# Patient Record
Sex: Male | Born: 1950
Health system: Southern US, Community
[De-identification: ages and names within clinical notes are randomized; demographics above are authoritative.]

## PROBLEM LIST (undated history)

## (undated) DIAGNOSIS — I10 Essential (primary) hypertension: Secondary | ICD-10-CM

## (undated) HISTORY — DX: Essential (primary) hypertension: I10

---

## 1996-11-29 HISTORY — PX: OTHER SURGICAL HISTORY: SHX169

## 2011-11-30 HISTORY — PX: X-STOP IMPLANTATION: SHX2677

## 2013-01-28 ENCOUNTER — Ambulatory Visit: Payer: Self-pay

## 2013-01-28 ENCOUNTER — Ambulatory Visit: Payer: 59 | Admitting: Internal Medicine

## 2013-01-28 ENCOUNTER — Encounter: Payer: Self-pay | Admitting: Internal Medicine

## 2013-01-28 VITALS — BP 172/113 | HR 95 | Temp 97.8°F | Resp 20

## 2013-01-28 DIAGNOSIS — M79645 Pain in left finger(s): Secondary | ICD-10-CM

## 2013-01-28 DIAGNOSIS — S61209A Unspecified open wound of unspecified finger without damage to nail, initial encounter: Secondary | ICD-10-CM

## 2013-01-28 DIAGNOSIS — S61228S Laceration with foreign body of other finger without damage to nail, sequela: Secondary | ICD-10-CM

## 2013-01-28 DIAGNOSIS — M79609 Pain in unspecified limb: Secondary | ICD-10-CM

## 2013-01-28 MED ORDER — DOXYCYCLINE HYCLATE 100 MG PO TABS: 100.0000 mg | ORAL_TABLET | Freq: Two times a day (BID) | ORAL | Status: DC

## 2013-01-28 MED ORDER — HYDROCODONE-ACETAMINOPHEN 5-325 MG PO TABS: 1.0000 | ORAL_TABLET | Freq: Four times a day (QID) | ORAL | Status: DC | PRN

## 2013-01-28 NOTE — Patient Instructions (Signed)
Wound Care  Wound care helps prevent pain and infection.    You may need a tetanus shot if:   You cannot remember when you had your last tetanus shot.   You have never had a tetanus shot.   The injury broke your skin.  If you need a tetanus shot and you choose not to have one, you may get tetanus. Sickness from tetanus can be serious.  HOME CARE     Only take medicine as told by your doctor.   Clean the wound daily with mild soap and water.   Change any bandages (dressings) as told by your doctor.   Put medicated cream and a bandage on the wound as told by your doctor.   Change the bandage if it gets wet, dirty, or starts to smell.   Take showers. Do not take baths, swim, or do anything that puts your wound under water.   Rest and raise (elevate) the wound until the pain and puffiness (swelling) are better.   Keep all doctor visits as told.  GET HELP RIGHT AWAY IF:     Yellowish-white fluid (pus) comes from the wound.   Medicine does not lessen your pain.   There is a red streak going away from the wound.   You have a fever.  MAKE SURE YOU:     Understand these instructions.   Will watch your condition.   Will get help right away if you are not doing well or get worse.  Document Released: 08/24/2008 Document Revised: 02/07/2012 Document Reviewed: 03/21/2011  [employer] Patient Information 2013 [employer].
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
## 2017-04-28 ENCOUNTER — Other Ambulatory Visit: Payer: Self-pay | Admitting: Orthopedic Surgery

## 2017-04-28 DIAGNOSIS — G8929 Other chronic pain: Secondary | ICD-10-CM

## 2017-04-28 DIAGNOSIS — M5442 Lumbago with sciatica, left side: Principal | ICD-10-CM

## 2017-05-03 ENCOUNTER — Other Ambulatory Visit: Payer: Self-pay | Admitting: Orthopedic Surgery

## 2017-05-03 ENCOUNTER — Ambulatory Visit
Admission: RE | Admit: 2017-05-03 | Discharge: 2017-05-03 | Disposition: A | Discharge status: Home / Self Care | Payer: Self-pay | Source: Ambulatory Visit | Attending: Orthopedic Surgery | Admitting: Orthopedic Surgery

## 2017-05-03 DIAGNOSIS — R52 Pain, unspecified: Secondary | ICD-10-CM

## 2017-05-04 ENCOUNTER — Ambulatory Visit
Admission: RE | Admit: 2017-05-04 | Discharge: 2017-05-04 | Disposition: A | Discharge status: Home / Self Care | Payer: BLUE CROSS/BLUE SHIELD | Source: Ambulatory Visit | Attending: Orthopedic Surgery | Admitting: Orthopedic Surgery

## 2017-05-04 DIAGNOSIS — G8929 Other chronic pain: Secondary | ICD-10-CM

## 2017-05-04 DIAGNOSIS — M5442 Lumbago with sciatica, left side: Principal | ICD-10-CM

## 2017-05-04 IMAGING — CT CT L SPINE W/ CM
1 of 6 series · 6 of 14 positions shown, 8 images · non-contrast
Comparison: MRI of the lumbar spine [DATE]

CLINICAL DATA: Low back pain extending into the left buttock and
posterior calf. Tingling in the right lower extremity, more
laterally.
TECHNIQUE: Contiguous axial images were obtained through the Lumbar spine after
the intrathecal infusion of infusion. Coronal and sagittal
reconstructions were obtained of the axial image sets.

[Series 3: l spine soft · axial · 0.27mm/px · z∈[-323,-143]mm · 6 of 86 slices shown, 8 images]
[im 13/86  soft-tissue]
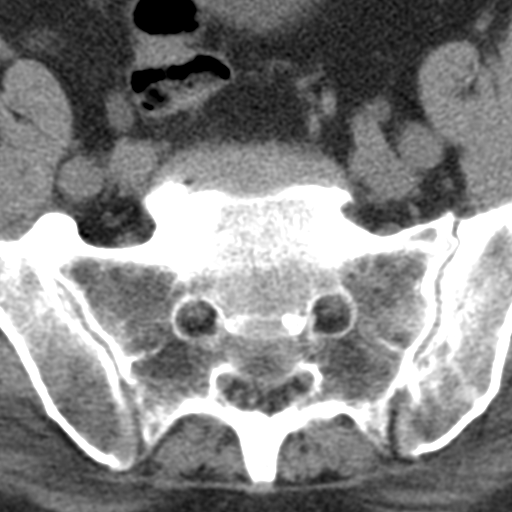
[im 13/86  bone]
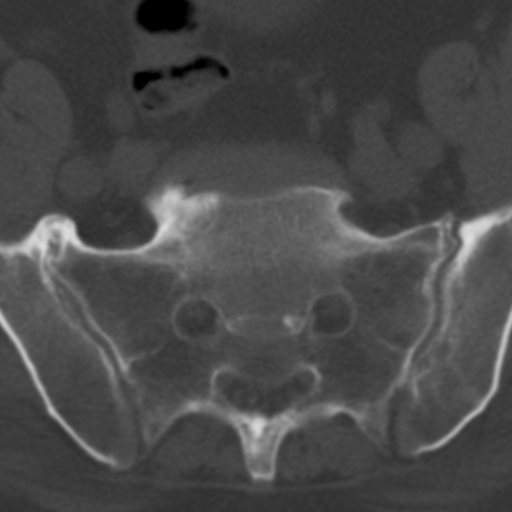
[im 25/86  bone]
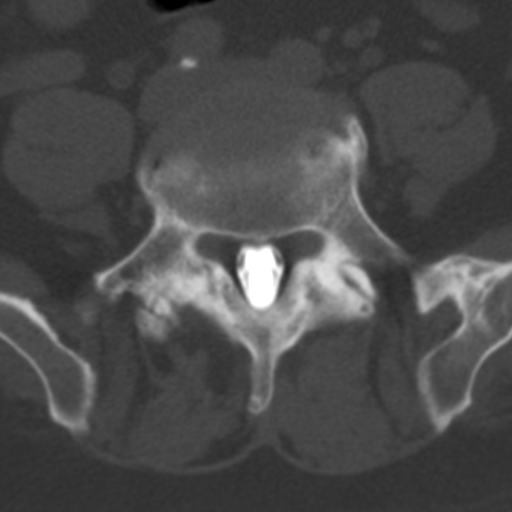
[im 37/86  bone]
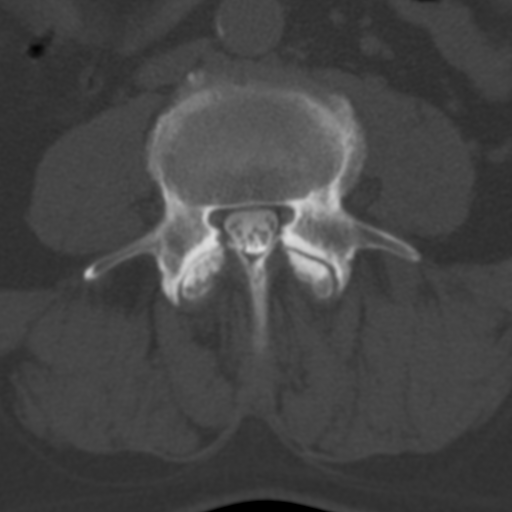
[im 49/86  bone]
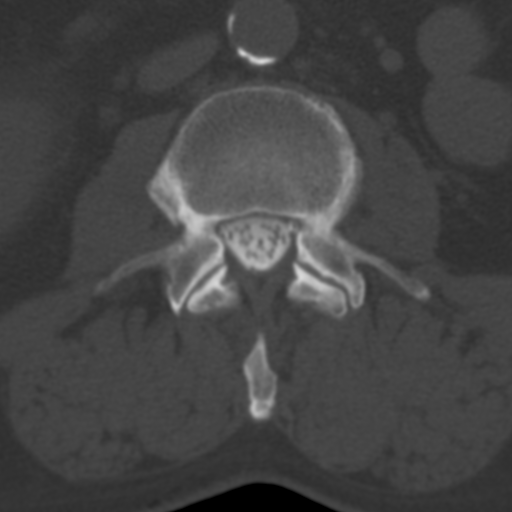
[im 61/86  soft-tissue]
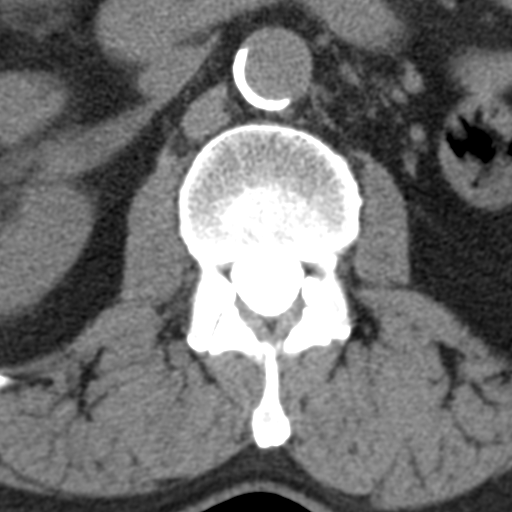
[im 61/86  bone]
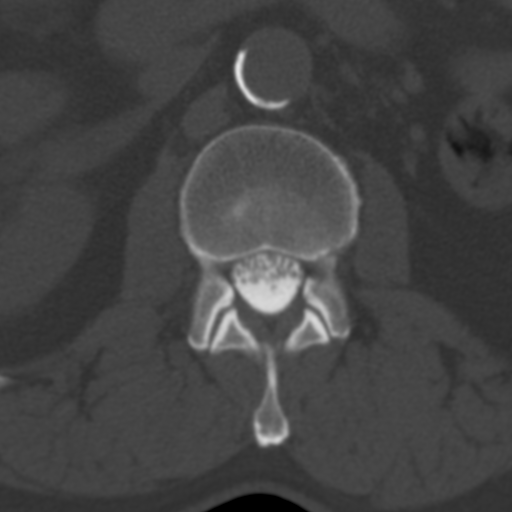
[im 73/86  bone]
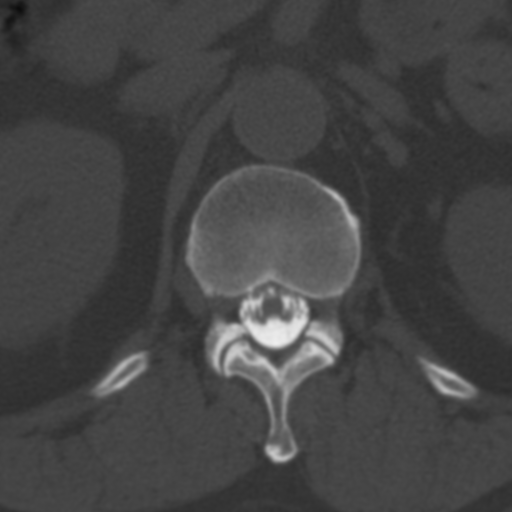

[6 of 14 positions shown; findings below may reference images not displayed]

EXAM:
LUMBAR MYELOGRAM

FLUOROSCOPY TIME:  Radiation Exposure Index (as provided by the
fluoroscopic device): 201.01 uGy*m2

Fluoroscopy Time:  19 seconds

Number of Acquired Images:  16

PROCEDURE:
After thorough discussion of risks and benefits of the procedure
including bleeding, infection, injury to nerves, blood vessels,
adjacent structures as well as headache and CSF leak, written and
oral informed consent was obtained. Consent was obtained by Dr.
KAELA. Time out form was completed.

Patient was positioned prone on the fluoroscopy table. Local
anesthesia was provided with 1% lidocaine without epinephrine after
prepped and draped in the usual sterile fashion. Puncture was
performed at L2-3 using a 3 1/2 inch 22-gauge spinal needle via
right paramedian approach. Using a single pass through the dura, the
needle was placed within the thecal sac, with return of clear CSF.
15 mL of Isovue M 200 was injected into the thecal sac, with normal
opacification of the nerve roots and cauda equina consistent with
free flow within the subarachnoid space.

I personally performed the lumbar puncture and administered the
intrathecal contrast. I also personally supervised acquisition of
the myelogram images.
FINDINGS: LUMBAR MYELOGRAM FINDINGS:

Five non rib-bearing lumbar type vertebral bodies are present.
Slight anterolisthesis is present at L4-5. Vertebral body heights
and alignment are otherwise maintained. Mild disc bulging is present
at L3-4 without significant stenosis. A broad-based disc protrusion
and facet disease is present at L4-5. There is moderate central
canal stenosis with left greater than right subarticular narrowing.
A broad-based disc protrusion with chronic loss of disc height is
evident at L5-S1. Mild subarticular narrowing is present
bilaterally.

The disc disease at L3-4 and L4-5 is slightly worse with standing.
This does not change significantly with flexion or extension. There
is no abnormal motion with flexion or extension.

CT LUMBAR MYELOGRAM FINDINGS:

The lumbar spine is imaged from T11-12 through S3-4. Slight
rightward curvature is centered at L4. Vertebral body heights are
maintained. There is calcification of the disc at T12-L1. The conus
medullaris terminates at L1, within normal limits.

Atherosclerotic calcifications are present in the aorta and branch
vessels without aneurysm. The right SI joint is fused. Degenerate
scratched at the SI joints are fused bilaterally.

L1-2:  No significant disc protrusion or stenosis is present.

L2-3: Mild disc bulging and facet hypertrophy is present without
significant change in mild bilateral foraminal narrowing.

L3-4: A broad-based disc protrusion is present. Moderate facet
hypertrophy is evident bilaterally, left greater than right. This
results in mild subarticular narrowing, left greater than right.
Facet spurring contributes mild foraminal narrowing, left greater
than right.

L4-5: A broad-based disc protrusion is present. Moderate facet
hypertrophy is present bilaterally with prominent spur on the right.
This contributes to moderate subarticular stenosis bilaterally,
right greater than left. Moderate foraminal narrowing is noted
bilaterally as well.

L5-S1: A shallow central disc protrusion is again seen. This
potentially contacts the traversing S1 nerve roots. Moderate facet
spurring is worse on the right. This contributes to mild bilateral
foraminal narrowing.
IMPRESSION: 1. Moderate central canal stenosis at L4-5 secondary to a
broad-based disc protrusion and advanced facet hypertrophy with
spurring.
2. Moderate foraminal stenosis bilaterally at L4-5.
3. Shallow central disc protrusion potentially contacts the
traversing S1 nerve roots bilaterally at L5-S1.
4. Facet spurring contributes to mild bilateral foraminal stenosis
at L5-S1.
5. Mild subarticular and foraminal narrowing bilaterally L3-4 is
worse on the left.
6. Mild disc bulging and facet hypertrophy at L2-3 without
significant central canal stenosis. Mild bilateral foraminal
narrowing at this level is stable.
7. Aortic atherosclerosis without aneurysm.

## 2017-05-04 MED ORDER — IOPAMIDOL (ISOVUE-M 200) INJECTION 41%
15.0000 mL | Freq: Once | INTRAMUSCULAR | Status: AC
  Administered 2017-05-04: 15 mL via INTRATHECAL

## 2017-05-04 MED ORDER — DIAZEPAM 5 MG PO TABS
10.0000 mg | ORAL_TABLET | Freq: Once | ORAL | Status: AC
  Administered 2017-05-04: 10 mg via ORAL

## 2017-05-04 NOTE — Progress Notes (Signed)
Patient states he has been off Tramadol/Ultram for at least the past two days.  Brita Romp, RN

## 2017-05-04 NOTE — Discharge Instructions (Signed)
Myelogram Discharge Instructions  1. Go home and rest quietly for the next 24 hours.  It is important to lie flat for the next 24 hours.  Get up only to go to the restroom.  You may lie in the bed or on a couch on your back, your stomach, your left side or your right side.  You may have one pillow under your head.  You may have pillows between your knees while you are on your side or under your knees while you are on your back.  2. DO NOT drive today.  Recline the seat as far back as it will go, while still wearing your seat belt, on the way home.  3. You may get up to go to the bathroom as needed.  You may sit up for 10 minutes to eat.  You may resume your normal diet and medications unless otherwise indicated.  Drink lots of extra fluids today and tomorrow.  4. The incidence of headache, nausea, or vomiting is about 5% (one in 20 patients).  If you develop a headache, lie flat and drink plenty of fluids until the headache goes away.  Caffeinated beverages may be helpful.  If you develop severe nausea and vomiting or a headache that does not go away with flat bed rest, call [phone].  5. You may resume normal activities after your 24 hours of bed rest is over; however, do not exert yourself strongly or do any heavy lifting tomorrow. If when you get up you have a headache when standing, go back to bed and force fluids for another 24 hours.  6. Call your physician for a follow-up appointment.  The results of your myelogram will be sent directly to your physician by the following day.  7. If you have any questions or if complications develop after you arrive home, please call [phone].  Discharge instructions have been explained to the patient.  The patient, or the person responsible for the patient, fully understands these instructions.      May resume Ultram on May 05, 2017, after 10:30 am.

## 2017-05-26 ENCOUNTER — Other Ambulatory Visit: Payer: Self-pay | Admitting: Orthopedic Surgery

## 2017-05-26 DIAGNOSIS — M5442 Lumbago with sciatica, left side: Principal | ICD-10-CM

## 2017-05-26 DIAGNOSIS — G8929 Other chronic pain: Secondary | ICD-10-CM

## 2017-05-30 ENCOUNTER — Ambulatory Visit
Admission: RE | Admit: 2017-05-30 | Discharge: 2017-05-30 | Disposition: A | Discharge status: Home / Self Care | Payer: BLUE CROSS/BLUE SHIELD | Source: Ambulatory Visit | Attending: Orthopedic Surgery | Admitting: Orthopedic Surgery

## 2017-05-30 DIAGNOSIS — G8929 Other chronic pain: Secondary | ICD-10-CM

## 2017-05-30 DIAGNOSIS — M5442 Lumbago with sciatica, left side: Principal | ICD-10-CM

## 2017-05-30 MED ORDER — DIAZEPAM 5 MG PO TABS: 10.0000 mg | ORAL_TABLET | Freq: Once | ORAL | Status: DC

## 2017-05-30 NOTE — Discharge Instructions (Signed)

## 2017-07-20 DIAGNOSIS — R69 Illness, unspecified: Secondary | ICD-10-CM | POA: Diagnosis not present

## 2017-08-09 ENCOUNTER — Ambulatory Visit
Admission: RE | Admit: 2017-08-09 | Discharge: 2017-08-09 | Disposition: A | Discharge status: Home / Self Care | Payer: BLUE CROSS/BLUE SHIELD | Source: Ambulatory Visit | Attending: Orthopedic Surgery | Admitting: Orthopedic Surgery

## 2017-08-09 VITALS — BP 153/97 | HR 83

## 2017-08-09 DIAGNOSIS — M5442 Lumbago with sciatica, left side: Principal | ICD-10-CM

## 2017-08-09 DIAGNOSIS — G8929 Other chronic pain: Secondary | ICD-10-CM

## 2017-08-09 MED ORDER — DIAZEPAM 5 MG PO TABS
10.0000 mg | ORAL_TABLET | Freq: Once | ORAL | Status: AC
  Administered 2017-08-09: 10 mg via ORAL

## 2017-08-09 MED ORDER — METHYLPREDNISOLONE ACETATE 40 MG/ML INJ SUSP (RADIOLOG
120.0000 mg | Freq: Once | INTRAMUSCULAR | Status: AC
  Administered 2017-08-09: 120 mg via EPIDURAL

## 2017-08-09 MED ORDER — IOPAMIDOL (ISOVUE-M 200) INJECTION 41%
1.0000 mL | Freq: Once | INTRAMUSCULAR | Status: AC
  Administered 2017-08-09: 1 mL via EPIDURAL

## 2017-08-09 NOTE — Discharge Instructions (Signed)

## 2017-08-17 ENCOUNTER — Telehealth: Payer: Self-pay

## 2017-08-17 NOTE — Telephone Encounter (Signed)
Returned patient's call where he stated he received no relief from [facility] 08/09/17.  I encouraged patient to follow up with Dr. Gladstone Lighter to see if he'd want to consider an injection at a different level or a different type of injection altogether.  I also stated we put about two weeks between injections so that patients don't receive to much steroid too soon.  Brita Romp, RN

## 2017-12-12 DIAGNOSIS — R69 Illness, unspecified: Secondary | ICD-10-CM | POA: Diagnosis not present

## 2018-03-06 DIAGNOSIS — R69 Illness, unspecified: Secondary | ICD-10-CM | POA: Diagnosis not present

## 2018-04-27 DIAGNOSIS — R69 Illness, unspecified: Secondary | ICD-10-CM | POA: Diagnosis not present

## 2018-05-16 DIAGNOSIS — D044 Carcinoma in situ of skin of scalp and neck: Secondary | ICD-10-CM | POA: Diagnosis not present

## 2018-05-16 DIAGNOSIS — D229 Melanocytic nevi, unspecified: Secondary | ICD-10-CM | POA: Diagnosis not present

## 2018-05-16 DIAGNOSIS — L570 Actinic keratosis: Secondary | ICD-10-CM | POA: Diagnosis not present

## 2018-05-16 DIAGNOSIS — C44622 Squamous cell carcinoma of skin of right upper limb, including shoulder: Secondary | ICD-10-CM | POA: Diagnosis not present

## 2018-05-16 DIAGNOSIS — C4492 Squamous cell carcinoma of skin, unspecified: Secondary | ICD-10-CM

## 2018-05-16 HISTORY — DX: Squamous cell carcinoma of skin, unspecified: C44.92

## 2018-06-09 DIAGNOSIS — E663 Overweight: Secondary | ICD-10-CM | POA: Diagnosis not present

## 2018-06-09 DIAGNOSIS — I10 Essential (primary) hypertension: Secondary | ICD-10-CM | POA: Diagnosis not present

## 2018-06-09 DIAGNOSIS — E7800 Pure hypercholesterolemia, unspecified: Secondary | ICD-10-CM | POA: Diagnosis not present

## 2018-06-09 DIAGNOSIS — E1169 Type 2 diabetes mellitus with other specified complication: Secondary | ICD-10-CM | POA: Diagnosis not present

## 2018-06-09 DIAGNOSIS — Z23 Encounter for immunization: Secondary | ICD-10-CM | POA: Diagnosis not present

## 2018-06-09 DIAGNOSIS — Z7984 Long term (current) use of oral hypoglycemic drugs: Secondary | ICD-10-CM | POA: Diagnosis not present

## 2018-06-09 DIAGNOSIS — Z6828 Body mass index (BMI) 28.0-28.9, adult: Secondary | ICD-10-CM | POA: Diagnosis not present

## 2018-06-13 DIAGNOSIS — H5213 Myopia, bilateral: Secondary | ICD-10-CM | POA: Diagnosis not present

## 2018-07-10 DIAGNOSIS — R69 Illness, unspecified: Secondary | ICD-10-CM | POA: Diagnosis not present

## 2018-07-20 DIAGNOSIS — C44622 Squamous cell carcinoma of skin of right upper limb, including shoulder: Secondary | ICD-10-CM | POA: Diagnosis not present

## 2018-07-20 DIAGNOSIS — D044 Carcinoma in situ of skin of scalp and neck: Secondary | ICD-10-CM | POA: Diagnosis not present

## 2018-08-18 DIAGNOSIS — E785 Hyperlipidemia, unspecified: Secondary | ICD-10-CM | POA: Diagnosis not present

## 2018-08-18 DIAGNOSIS — Z7984 Long term (current) use of oral hypoglycemic drugs: Secondary | ICD-10-CM | POA: Diagnosis not present

## 2018-08-18 DIAGNOSIS — R32 Unspecified urinary incontinence: Secondary | ICD-10-CM | POA: Diagnosis not present

## 2018-08-18 DIAGNOSIS — Z833 Family history of diabetes mellitus: Secondary | ICD-10-CM | POA: Diagnosis not present

## 2018-08-18 DIAGNOSIS — Z809 Family history of malignant neoplasm, unspecified: Secondary | ICD-10-CM | POA: Diagnosis not present

## 2018-08-18 DIAGNOSIS — E1165 Type 2 diabetes mellitus with hyperglycemia: Secondary | ICD-10-CM | POA: Diagnosis not present

## 2018-08-18 DIAGNOSIS — Z7982 Long term (current) use of aspirin: Secondary | ICD-10-CM | POA: Diagnosis not present

## 2018-08-18 DIAGNOSIS — J302 Other seasonal allergic rhinitis: Secondary | ICD-10-CM | POA: Diagnosis not present

## 2018-08-18 DIAGNOSIS — B009 Herpesviral infection, unspecified: Secondary | ICD-10-CM | POA: Diagnosis not present

## 2018-08-18 DIAGNOSIS — I10 Essential (primary) hypertension: Secondary | ICD-10-CM | POA: Diagnosis not present

## 2018-10-02 DIAGNOSIS — L02415 Cutaneous abscess of right lower limb: Secondary | ICD-10-CM | POA: Diagnosis not present

## 2018-10-02 DIAGNOSIS — L0231 Cutaneous abscess of buttock: Secondary | ICD-10-CM | POA: Diagnosis not present

## 2018-10-06 DIAGNOSIS — E11628 Type 2 diabetes mellitus with other skin complications: Secondary | ICD-10-CM | POA: Diagnosis not present

## 2018-10-06 DIAGNOSIS — L02219 Cutaneous abscess of trunk, unspecified: Secondary | ICD-10-CM | POA: Diagnosis not present

## 2018-10-16 DIAGNOSIS — L570 Actinic keratosis: Secondary | ICD-10-CM | POA: Diagnosis not present

## 2018-10-16 DIAGNOSIS — D229 Melanocytic nevi, unspecified: Secondary | ICD-10-CM | POA: Diagnosis not present

## 2018-10-17 DIAGNOSIS — E119 Type 2 diabetes mellitus without complications: Secondary | ICD-10-CM | POA: Diagnosis not present

## 2018-10-31 DIAGNOSIS — R69 Illness, unspecified: Secondary | ICD-10-CM | POA: Diagnosis not present

## 2018-12-19 DIAGNOSIS — Z125 Encounter for screening for malignant neoplasm of prostate: Secondary | ICD-10-CM | POA: Diagnosis not present

## 2018-12-19 DIAGNOSIS — E1169 Type 2 diabetes mellitus with other specified complication: Secondary | ICD-10-CM | POA: Diagnosis not present

## 2018-12-19 DIAGNOSIS — Z0000 Encounter for general adult medical examination without abnormal findings: Secondary | ICD-10-CM | POA: Diagnosis not present

## 2018-12-19 DIAGNOSIS — M4726 Other spondylosis with radiculopathy, lumbar region: Secondary | ICD-10-CM | POA: Diagnosis not present

## 2018-12-19 DIAGNOSIS — I10 Essential (primary) hypertension: Secondary | ICD-10-CM | POA: Diagnosis not present

## 2018-12-19 DIAGNOSIS — Z6828 Body mass index (BMI) 28.0-28.9, adult: Secondary | ICD-10-CM | POA: Diagnosis not present

## 2018-12-19 DIAGNOSIS — E7800 Pure hypercholesterolemia, unspecified: Secondary | ICD-10-CM | POA: Diagnosis not present

## 2018-12-19 DIAGNOSIS — R69 Illness, unspecified: Secondary | ICD-10-CM | POA: Diagnosis not present

## 2018-12-19 DIAGNOSIS — E663 Overweight: Secondary | ICD-10-CM | POA: Diagnosis not present

## 2019-03-22 DIAGNOSIS — R509 Fever, unspecified: Secondary | ICD-10-CM | POA: Diagnosis not present

## 2019-03-22 DIAGNOSIS — N3000 Acute cystitis without hematuria: Secondary | ICD-10-CM | POA: Diagnosis not present

## 2019-03-22 DIAGNOSIS — R350 Frequency of micturition: Secondary | ICD-10-CM | POA: Diagnosis not present

## 2019-03-22 DIAGNOSIS — E119 Type 2 diabetes mellitus without complications: Secondary | ICD-10-CM | POA: Diagnosis not present

## 2019-03-26 DIAGNOSIS — N3000 Acute cystitis without hematuria: Secondary | ICD-10-CM | POA: Diagnosis not present

## 2019-03-27 DIAGNOSIS — Z0100 Encounter for examination of eyes and vision without abnormal findings: Secondary | ICD-10-CM | POA: Diagnosis not present

## 2019-04-10 DIAGNOSIS — N3000 Acute cystitis without hematuria: Secondary | ICD-10-CM | POA: Diagnosis not present

## 2019-05-24 DIAGNOSIS — E1169 Type 2 diabetes mellitus with other specified complication: Secondary | ICD-10-CM | POA: Diagnosis not present

## 2019-05-24 DIAGNOSIS — E7800 Pure hypercholesterolemia, unspecified: Secondary | ICD-10-CM | POA: Diagnosis not present

## 2019-05-24 DIAGNOSIS — M4726 Other spondylosis with radiculopathy, lumbar region: Secondary | ICD-10-CM | POA: Diagnosis not present

## 2019-05-24 DIAGNOSIS — I10 Essential (primary) hypertension: Secondary | ICD-10-CM | POA: Diagnosis not present

## 2019-06-14 DIAGNOSIS — I10 Essential (primary) hypertension: Secondary | ICD-10-CM | POA: Diagnosis not present

## 2019-06-14 DIAGNOSIS — E7800 Pure hypercholesterolemia, unspecified: Secondary | ICD-10-CM | POA: Diagnosis not present

## 2019-06-14 DIAGNOSIS — E1169 Type 2 diabetes mellitus with other specified complication: Secondary | ICD-10-CM | POA: Diagnosis not present

## 2019-06-14 DIAGNOSIS — Z7984 Long term (current) use of oral hypoglycemic drugs: Secondary | ICD-10-CM | POA: Diagnosis not present

## 2019-06-14 DIAGNOSIS — M4726 Other spondylosis with radiculopathy, lumbar region: Secondary | ICD-10-CM | POA: Diagnosis not present

## 2019-06-19 DIAGNOSIS — H524 Presbyopia: Secondary | ICD-10-CM | POA: Diagnosis not present

## 2019-06-20 DIAGNOSIS — Z7984 Long term (current) use of oral hypoglycemic drugs: Secondary | ICD-10-CM | POA: Diagnosis not present

## 2019-06-20 DIAGNOSIS — I10 Essential (primary) hypertension: Secondary | ICD-10-CM | POA: Diagnosis not present

## 2019-06-20 DIAGNOSIS — E7800 Pure hypercholesterolemia, unspecified: Secondary | ICD-10-CM | POA: Diagnosis not present

## 2019-06-20 DIAGNOSIS — E1169 Type 2 diabetes mellitus with other specified complication: Secondary | ICD-10-CM | POA: Diagnosis not present

## 2019-07-30 DIAGNOSIS — I10 Essential (primary) hypertension: Secondary | ICD-10-CM | POA: Diagnosis not present

## 2019-07-30 DIAGNOSIS — Z7984 Long term (current) use of oral hypoglycemic drugs: Secondary | ICD-10-CM | POA: Diagnosis not present

## 2019-07-30 DIAGNOSIS — E7800 Pure hypercholesterolemia, unspecified: Secondary | ICD-10-CM | POA: Diagnosis not present

## 2019-07-30 DIAGNOSIS — E1169 Type 2 diabetes mellitus with other specified complication: Secondary | ICD-10-CM | POA: Diagnosis not present

## 2019-07-30 DIAGNOSIS — Z23 Encounter for immunization: Secondary | ICD-10-CM | POA: Diagnosis not present

## 2019-08-20 DIAGNOSIS — I10 Essential (primary) hypertension: Secondary | ICD-10-CM | POA: Diagnosis not present

## 2019-08-20 DIAGNOSIS — M4726 Other spondylosis with radiculopathy, lumbar region: Secondary | ICD-10-CM | POA: Diagnosis not present

## 2019-08-20 DIAGNOSIS — E119 Type 2 diabetes mellitus without complications: Secondary | ICD-10-CM | POA: Diagnosis not present

## 2019-08-20 DIAGNOSIS — E1169 Type 2 diabetes mellitus with other specified complication: Secondary | ICD-10-CM | POA: Diagnosis not present

## 2019-08-20 DIAGNOSIS — E7800 Pure hypercholesterolemia, unspecified: Secondary | ICD-10-CM | POA: Diagnosis not present

## 2019-08-20 DIAGNOSIS — R69 Illness, unspecified: Secondary | ICD-10-CM | POA: Diagnosis not present

## 2019-08-22 DIAGNOSIS — R69 Illness, unspecified: Secondary | ICD-10-CM | POA: Diagnosis not present

## 2019-10-11 DIAGNOSIS — E119 Type 2 diabetes mellitus without complications: Secondary | ICD-10-CM | POA: Diagnosis not present

## 2019-10-11 DIAGNOSIS — I10 Essential (primary) hypertension: Secondary | ICD-10-CM | POA: Diagnosis not present

## 2019-10-11 DIAGNOSIS — E7800 Pure hypercholesterolemia, unspecified: Secondary | ICD-10-CM | POA: Diagnosis not present

## 2019-10-11 DIAGNOSIS — Z7984 Long term (current) use of oral hypoglycemic drugs: Secondary | ICD-10-CM | POA: Diagnosis not present

## 2019-10-11 DIAGNOSIS — E1169 Type 2 diabetes mellitus with other specified complication: Secondary | ICD-10-CM | POA: Diagnosis not present

## 2019-10-11 DIAGNOSIS — M4726 Other spondylosis with radiculopathy, lumbar region: Secondary | ICD-10-CM | POA: Diagnosis not present

## 2019-10-31 DIAGNOSIS — R69 Illness, unspecified: Secondary | ICD-10-CM | POA: Diagnosis not present

## 2019-11-24 DIAGNOSIS — R69 Illness, unspecified: Secondary | ICD-10-CM | POA: Diagnosis not present

## 2019-11-24 DIAGNOSIS — Z0101 Encounter for examination of eyes and vision with abnormal findings: Secondary | ICD-10-CM | POA: Diagnosis not present

## 2019-11-27 DIAGNOSIS — I10 Essential (primary) hypertension: Secondary | ICD-10-CM | POA: Diagnosis not present

## 2019-11-27 DIAGNOSIS — E1169 Type 2 diabetes mellitus with other specified complication: Secondary | ICD-10-CM | POA: Diagnosis not present

## 2019-11-27 DIAGNOSIS — E7800 Pure hypercholesterolemia, unspecified: Secondary | ICD-10-CM | POA: Diagnosis not present

## 2019-11-27 DIAGNOSIS — M4726 Other spondylosis with radiculopathy, lumbar region: Secondary | ICD-10-CM | POA: Diagnosis not present

## 2019-11-27 DIAGNOSIS — E119 Type 2 diabetes mellitus without complications: Secondary | ICD-10-CM | POA: Diagnosis not present

## 2019-11-27 DIAGNOSIS — Z7984 Long term (current) use of oral hypoglycemic drugs: Secondary | ICD-10-CM | POA: Diagnosis not present

## 2019-11-30 HISTORY — PX: LIMB SPARING RESECTION HIP W/ SADDLE JOINT REPLACEMENT: SUR829

## 2019-12-21 DIAGNOSIS — E119 Type 2 diabetes mellitus without complications: Secondary | ICD-10-CM | POA: Diagnosis not present

## 2019-12-21 DIAGNOSIS — E1169 Type 2 diabetes mellitus with other specified complication: Secondary | ICD-10-CM | POA: Diagnosis not present

## 2019-12-21 DIAGNOSIS — E7800 Pure hypercholesterolemia, unspecified: Secondary | ICD-10-CM | POA: Diagnosis not present

## 2019-12-25 ENCOUNTER — Ambulatory Visit: Payer: Self-pay

## 2019-12-26 DIAGNOSIS — I10 Essential (primary) hypertension: Secondary | ICD-10-CM | POA: Diagnosis not present

## 2019-12-26 DIAGNOSIS — E7800 Pure hypercholesterolemia, unspecified: Secondary | ICD-10-CM | POA: Diagnosis not present

## 2019-12-26 DIAGNOSIS — I700 Atherosclerosis of aorta: Secondary | ICD-10-CM | POA: Diagnosis not present

## 2019-12-26 DIAGNOSIS — E1169 Type 2 diabetes mellitus with other specified complication: Secondary | ICD-10-CM | POA: Diagnosis not present

## 2019-12-26 DIAGNOSIS — E663 Overweight: Secondary | ICD-10-CM | POA: Diagnosis not present

## 2020-01-03 ENCOUNTER — Ambulatory Visit: Payer: Medicare HMO | Attending: Internal Medicine

## 2020-01-03 DIAGNOSIS — Z23 Encounter for immunization: Secondary | ICD-10-CM | POA: Insufficient documentation

## 2020-01-03 NOTE — Progress Notes (Signed)
   Covid-19 Vaccination Clinic  Name:  Nihit Hoying    MRN: [id number] DOB: 10/17/1951  01/03/2020  Mr. Roering was observed post Covid-19 immunization for 15 minutes without incidence. He was provided with Vaccine Information Sheet and instruction to access the V-Safe system.   Mr. Spinney was instructed to call 911 with any severe reactions post vaccine: Marland Kitchen Difficulty breathing  . Swelling of your face and throat  . A fast heartbeat  . A bad rash all over your body  . Dizziness and weakness    Immunizations Administered    Name Date Dose VIS Date Route   Pfizer COVID-19 Vaccine 01/03/2020  1:34 PM 0.3 mL 11/09/2019 Intramuscular   Manufacturer: [place]   Lot: [id number]   [place]: [id number]

## 2020-01-14 DIAGNOSIS — E119 Type 2 diabetes mellitus without complications: Secondary | ICD-10-CM | POA: Diagnosis not present

## 2020-01-14 DIAGNOSIS — E1169 Type 2 diabetes mellitus with other specified complication: Secondary | ICD-10-CM | POA: Diagnosis not present

## 2020-01-14 DIAGNOSIS — E7800 Pure hypercholesterolemia, unspecified: Secondary | ICD-10-CM | POA: Diagnosis not present

## 2020-01-14 DIAGNOSIS — M4726 Other spondylosis with radiculopathy, lumbar region: Secondary | ICD-10-CM | POA: Diagnosis not present

## 2020-01-14 DIAGNOSIS — I10 Essential (primary) hypertension: Secondary | ICD-10-CM | POA: Diagnosis not present

## 2020-01-17 DIAGNOSIS — R69 Illness, unspecified: Secondary | ICD-10-CM | POA: Diagnosis not present

## 2020-01-19 ENCOUNTER — Ambulatory Visit: Payer: BLUE CROSS/BLUE SHIELD

## 2020-01-30 ENCOUNTER — Ambulatory Visit: Payer: Medicare HMO | Attending: Internal Medicine

## 2020-01-30 DIAGNOSIS — Z23 Encounter for immunization: Secondary | ICD-10-CM | POA: Insufficient documentation

## 2020-01-30 NOTE — Progress Notes (Signed)
   Covid-19 Vaccination Clinic  Name:  Edward Moore    MRN: [id number] DOB: 1951/06/23  01/30/2020  Mr. Edward Moore was observed post Covid-19 immunization for 15 minutes without incident. He was provided with Vaccine Information Sheet and instruction to access the V-Safe system.   Mr. Edward Moore was instructed to call 911 with any severe reactions post vaccine: Marland Kitchen Difficulty breathing  . Swelling of face and throat  . A fast heartbeat  . A bad rash all over body  . Dizziness and weakness   Immunizations Administered    Name Date Dose VIS Date Route   Pfizer COVID-19 Vaccine 01/30/2020  2:56 PM 0.3 mL 11/09/2019 Intramuscular   Manufacturer: [place]   Lot: [id number]   [place]: [id number]

## 2020-03-12 DIAGNOSIS — R69 Illness, unspecified: Secondary | ICD-10-CM | POA: Diagnosis not present

## 2020-04-16 DIAGNOSIS — E1169 Type 2 diabetes mellitus with other specified complication: Secondary | ICD-10-CM | POA: Diagnosis not present

## 2020-04-16 DIAGNOSIS — M4726 Other spondylosis with radiculopathy, lumbar region: Secondary | ICD-10-CM | POA: Diagnosis not present

## 2020-04-16 DIAGNOSIS — I10 Essential (primary) hypertension: Secondary | ICD-10-CM | POA: Diagnosis not present

## 2020-04-16 DIAGNOSIS — E119 Type 2 diabetes mellitus without complications: Secondary | ICD-10-CM | POA: Diagnosis not present

## 2020-04-16 DIAGNOSIS — E7800 Pure hypercholesterolemia, unspecified: Secondary | ICD-10-CM | POA: Diagnosis not present

## 2020-04-24 DIAGNOSIS — R69 Illness, unspecified: Secondary | ICD-10-CM | POA: Diagnosis not present

## 2020-04-29 ENCOUNTER — Encounter: Payer: Self-pay | Admitting: Dermatology

## 2020-04-29 ENCOUNTER — Ambulatory Visit: Payer: Medicare HMO | Admitting: Dermatology

## 2020-04-29 ENCOUNTER — Other Ambulatory Visit: Payer: Self-pay

## 2020-04-29 DIAGNOSIS — D485 Neoplasm of uncertain behavior of skin: Secondary | ICD-10-CM

## 2020-04-29 DIAGNOSIS — Z85828 Personal history of other malignant neoplasm of skin: Secondary | ICD-10-CM | POA: Diagnosis not present

## 2020-04-29 DIAGNOSIS — Z1283 Encounter for screening for malignant neoplasm of skin: Secondary | ICD-10-CM | POA: Diagnosis not present

## 2020-04-29 DIAGNOSIS — L820 Inflamed seborrheic keratosis: Secondary | ICD-10-CM | POA: Diagnosis not present

## 2020-04-29 DIAGNOSIS — D0439 Carcinoma in situ of skin of other parts of face: Secondary | ICD-10-CM | POA: Diagnosis not present

## 2020-04-29 NOTE — Patient Instructions (Signed)

## 2020-05-01 ENCOUNTER — Telehealth: Payer: Self-pay

## 2020-05-01 NOTE — Telephone Encounter (Signed)
-----   Message from Lavonna Monarch, MD sent at 05/01/2020  6:49 AM EDT ----- Schedule surgery with Dr. Darene Lamer

## 2020-05-01 NOTE — Telephone Encounter (Signed)
Phone call from patient returning our call for his pathology results. Patient aware of results.  

## 2020-05-01 NOTE — Telephone Encounter (Signed)
Phone call to patient with his Pathology results.  Voicemail left for patient to give the office a call back.

## 2020-05-04 ENCOUNTER — Encounter: Payer: Self-pay | Admitting: Dermatology

## 2020-05-04 NOTE — Progress Notes (Signed)
   Follow-Up Visit   Subjective  Edward Moore is a 69 y.o. male who presents for the following: Annual Exam (no new concerns).  New growth Location: Right forehead Duration:  Quality:  Associated Signs/Symptoms: Modifying Factors:  Severity:  Timing: Context: History of multiple nonmelanoma cancers  The following portions of the chart were reviewed this encounter and updated as appropriate: Allergies  Meds  Problems  Med Hx  Surg Hx  Fam Hx      Objective  Well appearing patient in no apparent distress; mood and affect are within normal limits.  A full examination was performed including scalp, head, eyes, ears, nose, lips, neck, chest, axillae, abdomen, back, buttocks, bilateral upper extremities, bilateral lower extremities, hands, feet, fingers, toes, fingernails, and toenails. All findings within normal limits unless otherwise noted below.   Assessment & Plan  Neoplasm of uncertain behavior of skin (2) Mid Forehead  Skin / nail biopsy Type of biopsy: tangential   Informed consent: discussed and consent obtained   Timeout: patient name, date of birth, surgical site, and procedure verified   Procedure prep:  Patient was prepped and draped in usual sterile fashion Prep type:  Chlorhexidine Anesthesia: the lesion was anesthetized in a standard fashion   Anesthetic:  1% lidocaine w/ epinephrine 1-100,000 local infiltration Instrument used: flexible razor blade   Hemostasis achieved with: ferric subsulfate   Outcome: patient tolerated procedure well   Post-procedure details: wound care instructions given    Specimen 1 - Surgical pathology Differential Diagnosis: bcc vs scc Check Margins: No  Right Upper Back  Skin / nail biopsy Type of biopsy: tangential   Informed consent: discussed and consent obtained   Timeout: patient name, date of birth, surgical site, and procedure verified   Procedure prep:  Patient was prepped and draped in usual sterile  fashion Prep type:  Chlorhexidine Anesthesia: the lesion was anesthetized in a standard fashion   Anesthetic:  1% lidocaine w/ epinephrine 1-100,000 local infiltration Instrument used: flexible razor blade   Hemostasis achieved with: ferric subsulfate   Outcome: patient tolerated procedure well   Post-procedure details: wound care instructions given    Specimen 2 - Surgical pathology Differential Diagnosis: bcc vs scc Check Margins: No  Skin cancer screening performed today.

## 2020-05-05 DIAGNOSIS — E1169 Type 2 diabetes mellitus with other specified complication: Secondary | ICD-10-CM | POA: Diagnosis not present

## 2020-05-05 DIAGNOSIS — E119 Type 2 diabetes mellitus without complications: Secondary | ICD-10-CM | POA: Diagnosis not present

## 2020-05-05 DIAGNOSIS — M4726 Other spondylosis with radiculopathy, lumbar region: Secondary | ICD-10-CM | POA: Diagnosis not present

## 2020-05-05 DIAGNOSIS — I10 Essential (primary) hypertension: Secondary | ICD-10-CM | POA: Diagnosis not present

## 2020-05-05 DIAGNOSIS — E7800 Pure hypercholesterolemia, unspecified: Secondary | ICD-10-CM | POA: Diagnosis not present

## 2020-05-15 ENCOUNTER — Encounter: Payer: Medicare HMO | Admitting: Dermatology

## 2020-06-13 DIAGNOSIS — H5213 Myopia, bilateral: Secondary | ICD-10-CM | POA: Diagnosis not present

## 2020-06-16 DIAGNOSIS — Z0100 Encounter for examination of eyes and vision without abnormal findings: Secondary | ICD-10-CM | POA: Diagnosis not present

## 2020-06-18 DIAGNOSIS — I10 Essential (primary) hypertension: Secondary | ICD-10-CM | POA: Diagnosis not present

## 2020-06-18 DIAGNOSIS — E1169 Type 2 diabetes mellitus with other specified complication: Secondary | ICD-10-CM | POA: Diagnosis not present

## 2020-06-18 DIAGNOSIS — E119 Type 2 diabetes mellitus without complications: Secondary | ICD-10-CM | POA: Diagnosis not present

## 2020-06-18 DIAGNOSIS — E7800 Pure hypercholesterolemia, unspecified: Secondary | ICD-10-CM | POA: Diagnosis not present

## 2020-06-18 DIAGNOSIS — M4726 Other spondylosis with radiculopathy, lumbar region: Secondary | ICD-10-CM | POA: Diagnosis not present

## 2020-06-30 DIAGNOSIS — B009 Herpesviral infection, unspecified: Secondary | ICD-10-CM | POA: Diagnosis not present

## 2020-06-30 DIAGNOSIS — E1151 Type 2 diabetes mellitus with diabetic peripheral angiopathy without gangrene: Secondary | ICD-10-CM | POA: Diagnosis not present

## 2020-06-30 DIAGNOSIS — J302 Other seasonal allergic rhinitis: Secondary | ICD-10-CM | POA: Diagnosis not present

## 2020-06-30 DIAGNOSIS — E785 Hyperlipidemia, unspecified: Secondary | ICD-10-CM | POA: Diagnosis not present

## 2020-06-30 DIAGNOSIS — Z008 Encounter for other general examination: Secondary | ICD-10-CM | POA: Diagnosis not present

## 2020-06-30 DIAGNOSIS — G8929 Other chronic pain: Secondary | ICD-10-CM | POA: Diagnosis not present

## 2020-06-30 DIAGNOSIS — I10 Essential (primary) hypertension: Secondary | ICD-10-CM | POA: Diagnosis not present

## 2020-06-30 DIAGNOSIS — E663 Overweight: Secondary | ICD-10-CM | POA: Diagnosis not present

## 2020-06-30 DIAGNOSIS — R32 Unspecified urinary incontinence: Secondary | ICD-10-CM | POA: Diagnosis not present

## 2020-06-30 DIAGNOSIS — Z6827 Body mass index (BMI) 27.0-27.9, adult: Secondary | ICD-10-CM | POA: Diagnosis not present

## 2020-07-18 DIAGNOSIS — E1169 Type 2 diabetes mellitus with other specified complication: Secondary | ICD-10-CM | POA: Diagnosis not present

## 2020-07-18 DIAGNOSIS — M4726 Other spondylosis with radiculopathy, lumbar region: Secondary | ICD-10-CM | POA: Diagnosis not present

## 2020-07-18 DIAGNOSIS — E119 Type 2 diabetes mellitus without complications: Secondary | ICD-10-CM | POA: Diagnosis not present

## 2020-07-18 DIAGNOSIS — E7800 Pure hypercholesterolemia, unspecified: Secondary | ICD-10-CM | POA: Diagnosis not present

## 2020-07-18 DIAGNOSIS — I10 Essential (primary) hypertension: Secondary | ICD-10-CM | POA: Diagnosis not present

## 2020-07-26 DIAGNOSIS — S83512A Sprain of anterior cruciate ligament of left knee, initial encounter: Secondary | ICD-10-CM | POA: Diagnosis not present

## 2020-07-26 DIAGNOSIS — M1611 Unilateral primary osteoarthritis, right hip: Secondary | ICD-10-CM | POA: Diagnosis not present

## 2020-08-13 DIAGNOSIS — I10 Essential (primary) hypertension: Secondary | ICD-10-CM | POA: Diagnosis not present

## 2020-08-13 DIAGNOSIS — E7800 Pure hypercholesterolemia, unspecified: Secondary | ICD-10-CM | POA: Diagnosis not present

## 2020-08-13 DIAGNOSIS — E1169 Type 2 diabetes mellitus with other specified complication: Secondary | ICD-10-CM | POA: Diagnosis not present

## 2020-08-13 DIAGNOSIS — E663 Overweight: Secondary | ICD-10-CM | POA: Diagnosis not present

## 2020-08-13 DIAGNOSIS — Z6826 Body mass index (BMI) 26.0-26.9, adult: Secondary | ICD-10-CM | POA: Diagnosis not present

## 2020-08-13 DIAGNOSIS — Z23 Encounter for immunization: Secondary | ICD-10-CM | POA: Diagnosis not present

## 2020-08-13 DIAGNOSIS — M1611 Unilateral primary osteoarthritis, right hip: Secondary | ICD-10-CM | POA: Diagnosis not present

## 2020-08-14 DIAGNOSIS — M25562 Pain in left knee: Secondary | ICD-10-CM | POA: Diagnosis not present

## 2020-08-26 DIAGNOSIS — M25562 Pain in left knee: Secondary | ICD-10-CM | POA: Diagnosis not present

## 2020-08-26 DIAGNOSIS — M1611 Unilateral primary osteoarthritis, right hip: Secondary | ICD-10-CM | POA: Diagnosis not present

## 2020-09-05 DIAGNOSIS — M1611 Unilateral primary osteoarthritis, right hip: Secondary | ICD-10-CM | POA: Diagnosis not present

## 2020-09-08 DIAGNOSIS — Z96641 Presence of right artificial hip joint: Secondary | ICD-10-CM | POA: Diagnosis not present

## 2020-09-08 DIAGNOSIS — M6281 Muscle weakness (generalized): Secondary | ICD-10-CM | POA: Diagnosis not present

## 2020-09-08 DIAGNOSIS — M25651 Stiffness of right hip, not elsewhere classified: Secondary | ICD-10-CM | POA: Diagnosis not present

## 2020-09-16 DIAGNOSIS — Z96641 Presence of right artificial hip joint: Secondary | ICD-10-CM | POA: Diagnosis not present

## 2020-09-16 DIAGNOSIS — Z9889 Other specified postprocedural states: Secondary | ICD-10-CM | POA: Diagnosis not present

## 2020-10-08 DIAGNOSIS — E119 Type 2 diabetes mellitus without complications: Secondary | ICD-10-CM | POA: Diagnosis not present

## 2020-10-14 DIAGNOSIS — M4726 Other spondylosis with radiculopathy, lumbar region: Secondary | ICD-10-CM | POA: Diagnosis not present

## 2020-10-14 DIAGNOSIS — E7800 Pure hypercholesterolemia, unspecified: Secondary | ICD-10-CM | POA: Diagnosis not present

## 2020-10-14 DIAGNOSIS — I10 Essential (primary) hypertension: Secondary | ICD-10-CM | POA: Diagnosis not present

## 2020-10-14 DIAGNOSIS — E1169 Type 2 diabetes mellitus with other specified complication: Secondary | ICD-10-CM | POA: Diagnosis not present

## 2020-10-14 DIAGNOSIS — M1611 Unilateral primary osteoarthritis, right hip: Secondary | ICD-10-CM | POA: Diagnosis not present

## 2020-10-14 DIAGNOSIS — E119 Type 2 diabetes mellitus without complications: Secondary | ICD-10-CM | POA: Diagnosis not present

## 2020-10-15 DIAGNOSIS — R69 Illness, unspecified: Secondary | ICD-10-CM | POA: Diagnosis not present

## 2020-10-16 ENCOUNTER — Other Ambulatory Visit: Payer: Self-pay

## 2020-10-16 ENCOUNTER — Encounter: Payer: Self-pay | Admitting: Dermatology

## 2020-10-16 ENCOUNTER — Ambulatory Visit (INDEPENDENT_AMBULATORY_CARE_PROVIDER_SITE_OTHER): Payer: Medicare HMO | Admitting: Dermatology

## 2020-10-16 DIAGNOSIS — D0439 Carcinoma in situ of skin of other parts of face: Secondary | ICD-10-CM | POA: Diagnosis not present

## 2020-10-16 DIAGNOSIS — D099 Carcinoma in situ, unspecified: Secondary | ICD-10-CM

## 2020-10-16 NOTE — Patient Instructions (Signed)

## 2020-11-05 ENCOUNTER — Telehealth: Payer: Self-pay | Admitting: Dermatology

## 2020-11-05 NOTE — Telephone Encounter (Signed)
Update on forehead. Healing fine and no concerns.

## 2020-11-10 DIAGNOSIS — R69 Illness, unspecified: Secondary | ICD-10-CM | POA: Diagnosis not present

## 2020-11-11 DIAGNOSIS — M4726 Other spondylosis with radiculopathy, lumbar region: Secondary | ICD-10-CM | POA: Diagnosis not present

## 2020-11-11 DIAGNOSIS — E119 Type 2 diabetes mellitus without complications: Secondary | ICD-10-CM | POA: Diagnosis not present

## 2020-11-11 DIAGNOSIS — I10 Essential (primary) hypertension: Secondary | ICD-10-CM | POA: Diagnosis not present

## 2020-11-11 DIAGNOSIS — E7800 Pure hypercholesterolemia, unspecified: Secondary | ICD-10-CM | POA: Diagnosis not present

## 2020-11-11 DIAGNOSIS — E1169 Type 2 diabetes mellitus with other specified complication: Secondary | ICD-10-CM | POA: Diagnosis not present

## 2020-11-11 DIAGNOSIS — M1611 Unilateral primary osteoarthritis, right hip: Secondary | ICD-10-CM | POA: Diagnosis not present

## 2020-11-17 ENCOUNTER — Encounter: Payer: Self-pay | Admitting: Dermatology

## 2020-11-17 NOTE — Progress Notes (Signed)
   Follow-Up Visit   Subjective  Edward Moore is a 69 y.o. male who presents for the following: Procedure (scc mid forehead).  CIS Location: Forehead Duration:  Quality:  Associated Signs/Symptoms: Modifying Factors:  Severity:  Timing: Context: For treatment  Objective  Well appearing patient in no apparent distress; mood and affect are within normal limits. Objective  Mid Forehead: Biopsy site identified by patient, nurse, me. [id number]   A focused examination was performed including Head and neck.. Relevant physical exam findings are noted in the Assessment and Plan.   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  Destruction of lesion Complexity: simple   Destruction method: electrodesiccation and curettage   Informed consent: discussed and consent obtained   Timeout:  patient name, date of birth, surgical site, and procedure verified Anesthesia: the lesion was anesthetized in a standard fashion   Anesthetic:  1% lidocaine w/ epinephrine 1-100,000 local infiltration Curettage performed in three different directions: Yes   Curettage cycles:  3 Lesion length (cm):  1.7 Lesion width (cm):  1.7 Margin per side (cm):  0 Final wound size (cm):  1.7 Hemostasis achieved with:  ferric subsulfate Outcome: patient tolerated procedure well with no complications   Additional details:  Wound innoculated with 5 fluorouracil solution.     I, Lavonna Monarch, MD, have reviewed all documentation for this visit.  The documentation on 11/17/20 for the exam, diagnosis, procedures, and orders are all accurate and complete.

## 2020-12-16 DIAGNOSIS — I10 Essential (primary) hypertension: Secondary | ICD-10-CM | POA: Diagnosis not present

## 2020-12-16 DIAGNOSIS — E119 Type 2 diabetes mellitus without complications: Secondary | ICD-10-CM | POA: Diagnosis not present

## 2020-12-16 DIAGNOSIS — M1611 Unilateral primary osteoarthritis, right hip: Secondary | ICD-10-CM | POA: Diagnosis not present

## 2020-12-16 DIAGNOSIS — M4726 Other spondylosis with radiculopathy, lumbar region: Secondary | ICD-10-CM | POA: Diagnosis not present

## 2020-12-16 DIAGNOSIS — E1169 Type 2 diabetes mellitus with other specified complication: Secondary | ICD-10-CM | POA: Diagnosis not present

## 2020-12-16 DIAGNOSIS — E7800 Pure hypercholesterolemia, unspecified: Secondary | ICD-10-CM | POA: Diagnosis not present

## 2020-12-30 ENCOUNTER — Encounter: Payer: Self-pay | Admitting: Dermatology

## 2020-12-30 ENCOUNTER — Ambulatory Visit: Payer: Medicare HMO | Admitting: Dermatology

## 2020-12-30 ENCOUNTER — Other Ambulatory Visit: Payer: Self-pay

## 2020-12-30 DIAGNOSIS — Z1283 Encounter for screening for malignant neoplasm of skin: Secondary | ICD-10-CM

## 2020-12-30 DIAGNOSIS — L821 Other seborrheic keratosis: Secondary | ICD-10-CM | POA: Diagnosis not present

## 2020-12-30 DIAGNOSIS — Z8589 Personal history of malignant neoplasm of other organs and systems: Secondary | ICD-10-CM

## 2020-12-30 DIAGNOSIS — L570 Actinic keratosis: Secondary | ICD-10-CM

## 2020-12-30 DIAGNOSIS — Z85828 Personal history of other malignant neoplasm of skin: Secondary | ICD-10-CM

## 2020-12-30 DIAGNOSIS — L820 Inflamed seborrheic keratosis: Secondary | ICD-10-CM | POA: Diagnosis not present

## 2020-12-30 DIAGNOSIS — D485 Neoplasm of uncertain behavior of skin: Secondary | ICD-10-CM | POA: Diagnosis not present

## 2020-12-30 DIAGNOSIS — L738 Other specified follicular disorders: Secondary | ICD-10-CM | POA: Diagnosis not present

## 2020-12-30 NOTE — Patient Instructions (Signed)

## 2021-01-07 NOTE — Progress Notes (Signed)
   Follow-Up Visit   Subjective  Edward Moore is a 70 y.o. male who presents for the following: Follow-up (PLACE ON HIS BACK [facility]).  New spot on back, crusts on arms Location:  Duration:  Quality:  Associated Signs/Symptoms: Modifying Factors:  Severity:  Timing: Context: History of nonmelanoma skin cancer  Objective  Well appearing patient in no apparent distress; mood and affect are within normal limits. Objective  Scalp: Clinically clear  Objective  Left Lower Back: Pearly 5 mm papule with central erosion versus dilated pore, rule out BCC versus inflammatory lesion       Objective  Right Mid Back: Hemorrhagic 4 mm crust, rule out SCCA       Objective  Left Hand - Posterior (5), Right Hand - Posterior (5): Multiple 2 to 4 mm hornlike crusts distal forearms, dorsal hands bilaterally  Objective  Head to toe: Head to toe exam today. No signs of atypical moles or melanoma.   All skin waist up examined.   Assessment & Plan    History of squamous cell carcinoma Scalp  Recheck as needed change  Neoplasm of uncertain behavior of skin (2) Left Lower Back  Skin / nail biopsy Type of biopsy: tangential   Informed consent: discussed and consent obtained   Timeout: patient name, date of birth, surgical site, and procedure verified   Procedure prep:  Patient was prepped and draped in usual sterile fashion (Non sterile) Prep type:  Chlorhexidine Anesthesia: the lesion was anesthetized in a standard fashion   Anesthetic:  1% lidocaine w/ epinephrine 1-100,000 local infiltration Instrument used: flexible razor blade   Hemostasis achieved with: ferric subsulfate   Outcome: patient tolerated procedure well   Post-procedure details: sterile dressing applied and wound care instructions given   Dressing type: bandage and petrolatum    Specimen 1 - Surgical pathology Differential Diagnosis: R/O BCC vs SCC  Check Margins: No  Right Mid Back  Skin /  nail biopsy Type of biopsy: tangential   Informed consent: discussed and consent obtained   Timeout: patient name, date of birth, surgical site, and procedure verified   Procedure prep:  Patient was prepped and draped in usual sterile fashion (Non sterile) Prep type:  Chlorhexidine Anesthesia: the lesion was anesthetized in a standard fashion   Anesthetic:  1% lidocaine w/ epinephrine 1-100,000 local infiltration Instrument used: flexible razor blade   Hemostasis achieved with: ferric subsulfate   Outcome: patient tolerated procedure well   Post-procedure details: sterile dressing applied and wound care instructions given   Dressing type: bandage and petrolatum    Specimen 2 - Surgical pathology Differential Diagnosis: R/O BCC vs SCC  Check Margins: No  AK (actinic keratosis) (10) Left Hand - Posterior (5); Right Hand - Posterior (5)  Destruction of lesion - Left Hand - Posterior, Right Hand - Posterior Complexity: simple   Destruction method: cryotherapy   Informed consent: discussed and consent obtained   Lesion destroyed using liquid nitrogen: Yes   Cryotherapy cycles:  5 Outcome: patient tolerated procedure well with no complications    Skin exam for malignant neoplasm Head to toe     I, Lavonna Monarch, MD, have reviewed all documentation for this visit.  The documentation on 01/07/21 for the exam, diagnosis, procedures, and orders are all accurate and complete.

## 2021-01-08 DIAGNOSIS — E7800 Pure hypercholesterolemia, unspecified: Secondary | ICD-10-CM | POA: Diagnosis not present

## 2021-01-08 DIAGNOSIS — E1169 Type 2 diabetes mellitus with other specified complication: Secondary | ICD-10-CM | POA: Diagnosis not present

## 2021-01-08 DIAGNOSIS — M4726 Other spondylosis with radiculopathy, lumbar region: Secondary | ICD-10-CM | POA: Diagnosis not present

## 2021-01-08 DIAGNOSIS — I10 Essential (primary) hypertension: Secondary | ICD-10-CM | POA: Diagnosis not present

## 2021-01-08 DIAGNOSIS — E119 Type 2 diabetes mellitus without complications: Secondary | ICD-10-CM | POA: Diagnosis not present

## 2021-01-08 DIAGNOSIS — M1611 Unilateral primary osteoarthritis, right hip: Secondary | ICD-10-CM | POA: Diagnosis not present

## 2021-02-10 DIAGNOSIS — E1169 Type 2 diabetes mellitus with other specified complication: Secondary | ICD-10-CM | POA: Diagnosis not present

## 2021-02-10 DIAGNOSIS — E663 Overweight: Secondary | ICD-10-CM | POA: Diagnosis not present

## 2021-02-10 DIAGNOSIS — I10 Essential (primary) hypertension: Secondary | ICD-10-CM | POA: Diagnosis not present

## 2021-02-10 DIAGNOSIS — Z125 Encounter for screening for malignant neoplasm of prostate: Secondary | ICD-10-CM | POA: Diagnosis not present

## 2021-02-10 DIAGNOSIS — Z0000 Encounter for general adult medical examination without abnormal findings: Secondary | ICD-10-CM | POA: Diagnosis not present

## 2021-02-10 DIAGNOSIS — Z1389 Encounter for screening for other disorder: Secondary | ICD-10-CM | POA: Diagnosis not present

## 2021-02-10 DIAGNOSIS — E7800 Pure hypercholesterolemia, unspecified: Secondary | ICD-10-CM | POA: Diagnosis not present

## 2021-02-12 DIAGNOSIS — Z96641 Presence of right artificial hip joint: Secondary | ICD-10-CM | POA: Diagnosis not present

## 2021-02-12 DIAGNOSIS — Z471 Aftercare following joint replacement surgery: Secondary | ICD-10-CM | POA: Diagnosis not present

## 2021-02-16 ENCOUNTER — Ambulatory Visit: Payer: Medicare HMO | Admitting: Dermatology

## 2021-02-26 DIAGNOSIS — E7800 Pure hypercholesterolemia, unspecified: Secondary | ICD-10-CM | POA: Diagnosis not present

## 2021-02-26 DIAGNOSIS — E1169 Type 2 diabetes mellitus with other specified complication: Secondary | ICD-10-CM | POA: Diagnosis not present

## 2021-02-26 DIAGNOSIS — M4726 Other spondylosis with radiculopathy, lumbar region: Secondary | ICD-10-CM | POA: Diagnosis not present

## 2021-02-26 DIAGNOSIS — I10 Essential (primary) hypertension: Secondary | ICD-10-CM | POA: Diagnosis not present

## 2021-04-09 DIAGNOSIS — E1169 Type 2 diabetes mellitus with other specified complication: Secondary | ICD-10-CM | POA: Diagnosis not present

## 2021-04-09 DIAGNOSIS — M4726 Other spondylosis with radiculopathy, lumbar region: Secondary | ICD-10-CM | POA: Diagnosis not present

## 2021-04-09 DIAGNOSIS — I10 Essential (primary) hypertension: Secondary | ICD-10-CM | POA: Diagnosis not present

## 2021-04-09 DIAGNOSIS — E7800 Pure hypercholesterolemia, unspecified: Secondary | ICD-10-CM | POA: Diagnosis not present

## 2021-05-25 DIAGNOSIS — M4726 Other spondylosis with radiculopathy, lumbar region: Secondary | ICD-10-CM | POA: Diagnosis not present

## 2021-05-25 DIAGNOSIS — E1169 Type 2 diabetes mellitus with other specified complication: Secondary | ICD-10-CM | POA: Diagnosis not present

## 2021-05-25 DIAGNOSIS — E7800 Pure hypercholesterolemia, unspecified: Secondary | ICD-10-CM | POA: Diagnosis not present

## 2021-05-25 DIAGNOSIS — I10 Essential (primary) hypertension: Secondary | ICD-10-CM | POA: Diagnosis not present

## 2021-07-08 DIAGNOSIS — M4726 Other spondylosis with radiculopathy, lumbar region: Secondary | ICD-10-CM | POA: Diagnosis not present

## 2021-07-08 DIAGNOSIS — E7800 Pure hypercholesterolemia, unspecified: Secondary | ICD-10-CM | POA: Diagnosis not present

## 2021-07-08 DIAGNOSIS — E1169 Type 2 diabetes mellitus with other specified complication: Secondary | ICD-10-CM | POA: Diagnosis not present

## 2021-07-08 DIAGNOSIS — I10 Essential (primary) hypertension: Secondary | ICD-10-CM | POA: Diagnosis not present

## 2021-07-27 DIAGNOSIS — H5213 Myopia, bilateral: Secondary | ICD-10-CM | POA: Diagnosis not present

## 2021-07-28 DIAGNOSIS — Z0100 Encounter for examination of eyes and vision without abnormal findings: Secondary | ICD-10-CM | POA: Diagnosis not present

## 2021-08-14 DIAGNOSIS — Z79899 Other long term (current) drug therapy: Secondary | ICD-10-CM | POA: Diagnosis not present

## 2021-08-14 DIAGNOSIS — I10 Essential (primary) hypertension: Secondary | ICD-10-CM | POA: Diagnosis not present

## 2021-08-14 DIAGNOSIS — E1169 Type 2 diabetes mellitus with other specified complication: Secondary | ICD-10-CM | POA: Diagnosis not present

## 2021-08-14 DIAGNOSIS — Z23 Encounter for immunization: Secondary | ICD-10-CM | POA: Diagnosis not present

## 2021-08-14 DIAGNOSIS — E7800 Pure hypercholesterolemia, unspecified: Secondary | ICD-10-CM | POA: Diagnosis not present

## 2021-08-20 DIAGNOSIS — M4726 Other spondylosis with radiculopathy, lumbar region: Secondary | ICD-10-CM | POA: Diagnosis not present

## 2021-08-20 DIAGNOSIS — E1169 Type 2 diabetes mellitus with other specified complication: Secondary | ICD-10-CM | POA: Diagnosis not present

## 2021-08-20 DIAGNOSIS — E7800 Pure hypercholesterolemia, unspecified: Secondary | ICD-10-CM | POA: Diagnosis not present

## 2021-08-20 DIAGNOSIS — I10 Essential (primary) hypertension: Secondary | ICD-10-CM | POA: Diagnosis not present

## 2021-10-05 DIAGNOSIS — I10 Essential (primary) hypertension: Secondary | ICD-10-CM | POA: Diagnosis not present

## 2021-10-05 DIAGNOSIS — M4726 Other spondylosis with radiculopathy, lumbar region: Secondary | ICD-10-CM | POA: Diagnosis not present

## 2021-10-05 DIAGNOSIS — E1169 Type 2 diabetes mellitus with other specified complication: Secondary | ICD-10-CM | POA: Diagnosis not present

## 2021-10-05 DIAGNOSIS — E7800 Pure hypercholesterolemia, unspecified: Secondary | ICD-10-CM | POA: Diagnosis not present

## 2021-11-18 DIAGNOSIS — Z7984 Long term (current) use of oral hypoglycemic drugs: Secondary | ICD-10-CM | POA: Diagnosis not present

## 2021-11-18 DIAGNOSIS — I10 Essential (primary) hypertension: Secondary | ICD-10-CM | POA: Diagnosis not present

## 2021-11-18 DIAGNOSIS — Z809 Family history of malignant neoplasm, unspecified: Secondary | ICD-10-CM | POA: Diagnosis not present

## 2021-11-18 DIAGNOSIS — Z7982 Long term (current) use of aspirin: Secondary | ICD-10-CM | POA: Diagnosis not present

## 2021-11-18 DIAGNOSIS — E785 Hyperlipidemia, unspecified: Secondary | ICD-10-CM | POA: Diagnosis not present

## 2021-11-18 DIAGNOSIS — E119 Type 2 diabetes mellitus without complications: Secondary | ICD-10-CM | POA: Diagnosis not present

## 2021-11-18 DIAGNOSIS — B009 Herpesviral infection, unspecified: Secondary | ICD-10-CM | POA: Diagnosis not present

## 2021-11-18 DIAGNOSIS — Z96641 Presence of right artificial hip joint: Secondary | ICD-10-CM | POA: Diagnosis not present

## 2021-11-18 DIAGNOSIS — E663 Overweight: Secondary | ICD-10-CM | POA: Diagnosis not present

## 2021-11-18 DIAGNOSIS — R32 Unspecified urinary incontinence: Secondary | ICD-10-CM | POA: Diagnosis not present

## 2021-12-23 DIAGNOSIS — D123 Benign neoplasm of transverse colon: Secondary | ICD-10-CM | POA: Diagnosis not present

## 2021-12-23 DIAGNOSIS — K648 Other hemorrhoids: Secondary | ICD-10-CM | POA: Diagnosis not present

## 2021-12-23 DIAGNOSIS — D122 Benign neoplasm of ascending colon: Secondary | ICD-10-CM | POA: Diagnosis not present

## 2021-12-23 DIAGNOSIS — K5730 Diverticulosis of large intestine without perforation or abscess without bleeding: Secondary | ICD-10-CM | POA: Diagnosis not present

## 2021-12-23 DIAGNOSIS — Z86010 Personal history of colonic polyps: Secondary | ICD-10-CM | POA: Diagnosis not present

## 2021-12-23 DIAGNOSIS — D125 Benign neoplasm of sigmoid colon: Secondary | ICD-10-CM | POA: Diagnosis not present

## 2021-12-25 DIAGNOSIS — D123 Benign neoplasm of transverse colon: Secondary | ICD-10-CM | POA: Diagnosis not present

## 2021-12-25 DIAGNOSIS — D125 Benign neoplasm of sigmoid colon: Secondary | ICD-10-CM | POA: Diagnosis not present

## 2021-12-25 DIAGNOSIS — D122 Benign neoplasm of ascending colon: Secondary | ICD-10-CM | POA: Diagnosis not present

## 2021-12-30 ENCOUNTER — Ambulatory Visit: Payer: Medicare HMO | Admitting: Dermatology

## 2021-12-30 ENCOUNTER — Other Ambulatory Visit: Payer: Self-pay

## 2021-12-30 DIAGNOSIS — L570 Actinic keratosis: Secondary | ICD-10-CM | POA: Diagnosis not present

## 2021-12-30 DIAGNOSIS — L821 Other seborrheic keratosis: Secondary | ICD-10-CM

## 2021-12-30 DIAGNOSIS — Z85828 Personal history of other malignant neoplasm of skin: Secondary | ICD-10-CM

## 2021-12-30 DIAGNOSIS — L851 Acquired keratosis [keratoderma] palmaris et plantaris: Secondary | ICD-10-CM | POA: Diagnosis not present

## 2021-12-30 DIAGNOSIS — L219 Seborrheic dermatitis, unspecified: Secondary | ICD-10-CM

## 2021-12-30 DIAGNOSIS — Z1283 Encounter for screening for malignant neoplasm of skin: Secondary | ICD-10-CM

## 2021-12-30 MED ORDER — TOLAK 4 % EX CREA: 1.0000 "application " | TOPICAL_CREAM | Freq: Every evening | CUTANEOUS | 0 refills | Status: DC

## 2021-12-30 NOTE — Patient Instructions (Addendum)
1% otc hydrocortisone for forehead

## 2021-12-30 NOTE — Progress Notes (Deleted)
° °  Follow-Up Visit   Subjective  Edward Moore is a 71 y.o. male who presents for the following: Annual Exam (No concerns. History of non mole skin cancer. ).     The following portions of the chart were reviewed this encounter and updated as appropriate:      {Review of Systems:34166::"No other skin or systemic complaints."}  Objective  Well appearing patient in no apparent distress; mood and affect are within normal limits.  A full examination was performed including scalp, head, eyes, ears, nose, lips, neck, chest, axillae, abdomen, back, buttocks, bilateral upper extremities, bilateral lower extremities, hands, feet, fingers, toes, fingernails, and toenails. All findings within normal limits unless otherwise noted below.  Scalp Full body exam   Assessment & Plan  Encounter for screening for malignant neoplasm of skin Scalp  Seborrheic keratosis (2) Right Temple; Right Buccal Cheek  Seborrheic dermatitis Mid Forehead  1 % hydrocortisone   AK (actinic keratosis) (7) Head - Anterior (Face); Left Forearm - Posterior (3); Right Forearm - Posterior (3)  If patient gets to irritated we can phone in triamcinolone cream to use if needed.   Fluorouracil (TOLAK) 4 % CREA - Head - Anterior (Face), Left Forearm - Posterior, Right Forearm - Posterior Apply 1 application topically at bedtime. 28 applications  Acrokeratosis (2) Right Popliteal Fossa; Left Lower Leg - Posterior   No follow-ups on file.

## 2021-12-31 DIAGNOSIS — Z0100 Encounter for examination of eyes and vision without abnormal findings: Secondary | ICD-10-CM | POA: Diagnosis not present

## 2022-01-22 ENCOUNTER — Encounter: Payer: Self-pay | Admitting: Dermatology

## 2022-01-23 NOTE — Progress Notes (Signed)
° °  Follow-Up Visit   Subjective  Edward Moore is a 71 y.o. male who presents for the following: Annual Exam (No concerns. History of non mole skin cancer. ).  General skin examination Location:  Duration:  Quality:  Associated Signs/Symptoms: Modifying Factors:  Severity:  Timing: Context:   Objective  Well appearing patient in no apparent distress; mood and affect are within normal limits. Scalp Full body exam: No atypical pigmented lesions or nonmelanoma skin cancer  Right Buccal Cheek, Right Temple Textured brown flattopped 6 mm papules, typical dermoscopy  Mid Forehead Subtle erythema and scale  Head - Anterior (Face), Left Forearm - Posterior (3), Right Forearm - Posterior (3) Multiple small 2 mm pink gritty actinic keratoses  Left Lower Leg - Posterior, Right Popliteal Fossa 2 mm hyperpigmented slightly verrucous papules    A full examination was performed including scalp, head, eyes, ears, nose, lips, neck, chest, axillae, abdomen, back, buttocks, bilateral upper extremities, bilateral lower extremities, hands, feet, fingers, toes, fingernails, and toenails. All findings within normal limits unless otherwise noted below.   Assessment & Plan    Encounter for screening for malignant neoplasm of skin Scalp  Annual skin examination  Seborrheic keratosis (2) Right Temple; Right Buccal Cheek  Leave if stable  Seborrheic dermatitis Mid Forehead  If bothersome we will try 1% hydrocortisone ointment nightly for 2 to 3 weeks  AK (actinic keratosis) (7) Head - Anterior (Face); Left Forearm - Posterior (3); Right Forearm - Posterior (3)  [place], goal of 28 total applications but if nightly application is too irritating take a week off and try every other night.  If patient gets too irritated we can phone in triamcinolone cream to use if needed.   Fluorouracil (TOLAK) 4 % CREA - Head - Anterior (Face), Left Forearm - Posterior, Right Forearm - Posterior Apply  1 application topically at bedtime. 28 applications  Acrokeratosis (2) Right Popliteal Fossa; Left Lower Leg - Posterior  No intervention necessary      I, Lavonna Monarch, MD, have reviewed all documentation for this visit.  The documentation on 01/23/22 for the exam, diagnosis, procedures, and orders are all accurate and complete.

## 2022-02-12 DIAGNOSIS — Z0000 Encounter for general adult medical examination without abnormal findings: Secondary | ICD-10-CM | POA: Diagnosis not present

## 2022-02-12 DIAGNOSIS — Z125 Encounter for screening for malignant neoplasm of prostate: Secondary | ICD-10-CM | POA: Diagnosis not present

## 2022-02-12 DIAGNOSIS — Z23 Encounter for immunization: Secondary | ICD-10-CM | POA: Diagnosis not present

## 2022-02-12 DIAGNOSIS — E1169 Type 2 diabetes mellitus with other specified complication: Secondary | ICD-10-CM | POA: Diagnosis not present

## 2022-02-12 DIAGNOSIS — Z1389 Encounter for screening for other disorder: Secondary | ICD-10-CM | POA: Diagnosis not present

## 2022-02-15 DIAGNOSIS — I10 Essential (primary) hypertension: Secondary | ICD-10-CM | POA: Diagnosis not present

## 2022-02-15 DIAGNOSIS — E663 Overweight: Secondary | ICD-10-CM | POA: Diagnosis not present

## 2022-02-15 DIAGNOSIS — E7800 Pure hypercholesterolemia, unspecified: Secondary | ICD-10-CM | POA: Diagnosis not present

## 2022-02-15 DIAGNOSIS — I700 Atherosclerosis of aorta: Secondary | ICD-10-CM | POA: Diagnosis not present

## 2022-02-15 DIAGNOSIS — E1169 Type 2 diabetes mellitus with other specified complication: Secondary | ICD-10-CM | POA: Diagnosis not present

## 2022-08-16 DIAGNOSIS — E7800 Pure hypercholesterolemia, unspecified: Secondary | ICD-10-CM | POA: Diagnosis not present

## 2022-08-16 DIAGNOSIS — E1169 Type 2 diabetes mellitus with other specified complication: Secondary | ICD-10-CM | POA: Diagnosis not present

## 2022-08-16 DIAGNOSIS — I10 Essential (primary) hypertension: Secondary | ICD-10-CM | POA: Diagnosis not present

## 2022-08-17 DIAGNOSIS — E1169 Type 2 diabetes mellitus with other specified complication: Secondary | ICD-10-CM | POA: Diagnosis not present

## 2022-08-17 DIAGNOSIS — E7800 Pure hypercholesterolemia, unspecified: Secondary | ICD-10-CM | POA: Diagnosis not present

## 2022-08-17 DIAGNOSIS — I10 Essential (primary) hypertension: Secondary | ICD-10-CM | POA: Diagnosis not present

## 2022-08-17 DIAGNOSIS — Z23 Encounter for immunization: Secondary | ICD-10-CM | POA: Diagnosis not present

## 2022-10-07 DIAGNOSIS — D1801 Hemangioma of skin and subcutaneous tissue: Secondary | ICD-10-CM | POA: Diagnosis not present

## 2022-10-07 DIAGNOSIS — L570 Actinic keratosis: Secondary | ICD-10-CM | POA: Diagnosis not present

## 2022-10-07 DIAGNOSIS — L814 Other melanin hyperpigmentation: Secondary | ICD-10-CM | POA: Diagnosis not present

## 2022-10-07 DIAGNOSIS — L821 Other seborrheic keratosis: Secondary | ICD-10-CM | POA: Diagnosis not present

## 2023-01-03 ENCOUNTER — Ambulatory Visit: Payer: Medicare HMO | Admitting: Dermatology

## 2023-02-14 DIAGNOSIS — Z1389 Encounter for screening for other disorder: Secondary | ICD-10-CM | POA: Diagnosis not present

## 2023-02-14 DIAGNOSIS — E1169 Type 2 diabetes mellitus with other specified complication: Secondary | ICD-10-CM | POA: Diagnosis not present

## 2023-02-14 DIAGNOSIS — Z0000 Encounter for general adult medical examination without abnormal findings: Secondary | ICD-10-CM | POA: Diagnosis not present

## 2023-02-14 DIAGNOSIS — Z6829 Body mass index (BMI) 29.0-29.9, adult: Secondary | ICD-10-CM | POA: Diagnosis not present

## 2023-02-16 DIAGNOSIS — I10 Essential (primary) hypertension: Secondary | ICD-10-CM | POA: Diagnosis not present

## 2023-02-16 DIAGNOSIS — Z125 Encounter for screening for malignant neoplasm of prostate: Secondary | ICD-10-CM | POA: Diagnosis not present

## 2023-02-16 DIAGNOSIS — E7800 Pure hypercholesterolemia, unspecified: Secondary | ICD-10-CM | POA: Diagnosis not present

## 2023-02-16 DIAGNOSIS — E119 Type 2 diabetes mellitus without complications: Secondary | ICD-10-CM | POA: Diagnosis not present

## 2023-02-16 DIAGNOSIS — E663 Overweight: Secondary | ICD-10-CM | POA: Diagnosis not present

## 2023-02-16 DIAGNOSIS — E538 Deficiency of other specified B group vitamins: Secondary | ICD-10-CM | POA: Diagnosis not present

## 2023-02-16 DIAGNOSIS — I700 Atherosclerosis of aorta: Secondary | ICD-10-CM | POA: Diagnosis not present

## 2023-03-10 ENCOUNTER — Other Ambulatory Visit: Payer: Self-pay

## 2023-03-10 ENCOUNTER — Encounter: Payer: Self-pay | Admitting: Allergy

## 2023-03-10 ENCOUNTER — Ambulatory Visit: Payer: Medicare HMO | Admitting: Allergy

## 2023-03-10 VITALS — BP 136/78 | HR 72 | Temp 98.3°F | Resp 18 | Ht 66.5 in | Wt 186.0 lb

## 2023-03-10 DIAGNOSIS — J3089 Other allergic rhinitis: Secondary | ICD-10-CM

## 2023-03-10 MED ORDER — LEVOCETIRIZINE DIHYDROCHLORIDE 5 MG PO TABS: 5.0000 mg | ORAL_TABLET | Freq: Every evening | ORAL | 5 refills | Status: DC

## 2023-03-10 MED ORDER — AZELASTINE HCL 0.1 % NA SOLN: NASAL | 5 refills | Status: DC

## 2023-03-10 NOTE — Progress Notes (Signed)
New Patient Note  RE: Edward Moore MRN: [id number] DOB: May 06, 1951 Date of Office Visit: 03/10/2023   Primary care provider: Sigmund Hazel, MD  Chief Complaint: allergies  History of present illness: Edward Moore is a 72 y.o. male presenting today for evaluation of allergic rhinitis.   He reports symptoms of coughing, drainage, throat clearing, sneezing that happens right at the end of winter.  This has been issue for the past 6-7 years.  It causes him to cough and has issue sleeping.  He states will use cough medication, antihimstaine (loratadine) and flonase and after about 5 weeks these symptoms go away.  He states he was exposed to mold in a rental house and thinks maybe it started after this exposure.    No history of asthma, eczema or food allergy.    Review of systems in the past 4 weeks: Review of Systems  Constitutional: Negative.   HENT: Negative.    Eyes: Negative.   Respiratory: Negative.    Cardiovascular: Negative.   Musculoskeletal: Negative.   Skin: Negative.   Allergic/Immunologic: Negative.   Neurological: Negative.     All other systems negative unless noted above in HPI  Past medical history: Past Medical History:  Diagnosis Date   Hypertension    SCCA (squamous cell carcinoma) of skin 05/16/2018   Right Hand (curet and 5FU)   SCCA (squamous cell carcinoma) of skin 05/16/2018   Left Scalp (in situ) (curet and 5FU)   SCCA (squamous cell carcinoma) of skin 04/29/2020   Mid Forehead (in situ)    Past surgical history: Past Surgical History:  Procedure Laterality Date   LIMB SPARING RESECTION HIP W/ SADDLE JOINT REPLACEMENT  2021   Subarachnoid hemorrhage  1998   X-STOP IMPLANTATION  2013    Family history:  Family History  Problem Relation Age of Onset   Allergic rhinitis Mother    Allergic rhinitis Brother     Social history: Lives in a home without carpeting with heat pump heating and cooling.  No pets in the home.  No concern  for water damage, mildew roaches in the home.  He is retired.  He denies smoking history.    Medication List: Current Outpatient Medications  Medication Sig Dispense Refill   amLODipine (NORVASC) 5 MG tablet Take 5 mg by mouth daily.     aspirin 81 MG chewable tablet Chew by mouth daily.     atorvastatin (LIPITOR) 20 MG tablet Take 40 mg by mouth daily.     cyanocobalamin (VITAMIN B12) 500 MCG tablet Take 500 mcg by mouth daily.     Fluorouracil (TOLAK) 4 % CREA Apply 1 application topically at bedtime. 28 applications 40 g 0   losartan-hydrochlorothiazide (HYZAAR) 100-25 MG tablet Take 1 tablet by mouth daily.     metFORMIN (GLUCOPHAGE) 1000 MG tablet 1 tablet with a meal     metoprolol succinate (TOPROL-XL) 50 MG 24 hr tablet Take 50 mg by mouth daily.     ONETOUCH ULTRA test strip 1 each by Other route as needed.     valACYclovir (VALTREX) 500 MG tablet Take 500 mg by mouth daily.     No current facility-administered medications for this visit.    Known medication allergies: No Known Allergies   Physical examination: Blood pressure 136/78, pulse 72, temperature 98.3 F (36.8 C), temperature source Temporal, resp. rate 18, height 5' 6.5" (1.689 m), weight 186 lb (84.4 kg), SpO2 97 %.  General: Alert, interactive, in no acute distress. HEENT:  PERRLA, TMs pearly gray, turbinates non-edematous without discharge, post-pharynx non erythematous. Neck: Supple without lymphadenopathy. Lungs: Clear to auscultation without wheezing, rhonchi or rales. {no increased work of breathing. CV: Normal S1, S2 without murmurs. Abdomen: Nondistended, nontender. Skin: Warm and dry, without lesions or rashes. Extremities:  No clubbing, cyanosis or edema. Neuro:   Grossly intact.  Diagnositics/Labs:  Allergy testing:   Airborne Adult Perc - 03/10/23 0951     Time Antigen Placed [id number]    Allergen Manufacturer Waynette ButteryGreer    Location Back    Number of Test 58    1. Control-Buffer 50% Glycerol  Negative    2. Control-Histamine 1 mg/ml 2+    3. Albumin saline Negative    4. Bahia 2+    5. [place] 2+    6. Johnson 2+    7. [place] Blue Negative    9. Perennial Rye Negative    10. Sweet Vernal Negative    11. Timothy 2+    12. Cocklebur Negative    13. Burweed Marshelder 2+    14. Ragweed, short 2+    15. Ragweed, Giant Negative    16. Plantain,  English Negative    17. Lamb's Quarters Negative    18. Sheep Sorrell Negative    19. Rough Pigweed 2+    20. Marsh Elder, Rough 2+    21. Mugwort, Common 2+    22. Ash mix 3+    23. Birch mix Negative    24. Beech American 2+    25. Box, Elder Negative    26. Cedar, red Negative    27. Cottonwood, [place]iss[place]astern Negative    28. Elm mix Negative    29. Hickory Negative    30. Maple mix 2+    31. Oak, [place] mix 2+    32. Pecan Pollen Negative    33. Pine mix Negative    34. Sycamore Eastern Negative    35. Walnut, Black Pollen Negative    36. Alternaria alternata Negative    37. Cladosporium Herbarum Negative    38. Aspergillus mix Negative    39. Penicillium mix Negative    40. Bipolaris sorokiniana (Helminthosporium) Negative    41. Drechslera spicifera (Curvularia) Negative    42. Mucor plumbeus Negative    43. Fusarium moniliforme Negative    44. Aureobasidium pullulans (pullulara) Negative    45. Rhizopus oryzae Negative    46. Botrytis cinera Negative    47. Epicoccum nigrum Negative    48. Phoma betae Negative    49. Candida Albicans Negative    50. Trichophyton mentagrophytes Negative    51. Mite, D Farinae  5,000 AU/ml Negative    52. Mite, D Pteronyssinus  5,000 AU/ml Negative    53. Cat Hair 10,000 BAU/ml Negative    54.  Dog Epithelia Negative    55. Mixed Feathers Negative    56. Horse Epithelia Negative    57. Cockroach, German Negative    58. Mouse Negative    59. Tobacco Leaf Negative    Other Omitted    Other Omitted             Intradermal - 03/10/23 1034     Time Antigen Placed 1034     Allergen Manufacturer Waynette ButteryGreer    Location Arm    Number of Test 9    Control Negative    Mold 1 Negative    Mold 2 2+    Mold 3 Negative    Mold 4 2+  Cat 2+    Dog 2+    Cockroach 2+    Mite mix Negative             Allergy testing results were read and interpreted by provider, documented by clinical staff.   Assessment and plan: Allergic rhinitis  - Testing today showed: grasses, ragweed, weeds, trees, indoor molds, outdoor molds, cat, dog, and cockroach. - Copy of test results provided.  - Avoidance measures provided. - Start taking: Xyzal (levocetirizine) 5mg  tablet once daily. Astelin (azelastine) 2 sprays per nostril 1-2 times daily as needed for runny nose control - You can use an extra dose of the antihistamine, if needed, for breakthrough symptoms.  - Consider nasal saline rinses 1-2 times daily to remove allergens from the nasal cavities as well as help with mucous clearance (this is especially helpful to do before the nasal sprays are given) - Consider allergy shots as a means of long-term control. - Allergy shots "re-train" and "reset" the immune system to ignore environmental allergens and decrease the resulting immune response to those allergens (sneezing, itchy watery eyes, runny nose, nasal congestion, etc).    - Allergy shots improve symptoms in 75-85% of patients.  - We can discuss more at the next appointment if the medications are not working for you.  Follow-up in 6 months or sooner if needed  I appreciate the opportunity to take part in Immanuel's care. Please do not hesitate to contact me with questions.  Sincerely,   Margo Aye, MD Allergy/Immunology [facility]

## 2023-03-10 NOTE — Patient Instructions (Signed)
-   Testing today showed: grasses, ragweed, weeds, trees, indoor molds, outdoor molds, cat, dog, and cockroach. - Copy of test results provided.  - Avoidance measures provided. - Start taking: Xyzal (levocetirizine) 5mg  tablet once daily. Astelin (azelastine) 2 sprays per nostril 1-2 times daily as needed for runny nose control - You can use an extra dose of the antihistamine, if needed, for breakthrough symptoms.  - Consider nasal saline rinses 1-2 times daily to remove allergens from the nasal cavities as well as help with mucous clearance (this is especially helpful to do before the nasal sprays are given) - Consider allergy shots as a means of long-term control. - Allergy shots "re-train" and "reset" the immune system to ignore environmental allergens and decrease the resulting immune response to those allergens (sneezing, itchy watery eyes, runny nose, nasal congestion, etc).    - Allergy shots improve symptoms in 75-85% of patients.  - We can discuss more at the next appointment if the medications are not working for you.  Follow-up in 6 months or sooner if needed

## 2023-05-21 DIAGNOSIS — J018 Other acute sinusitis: Secondary | ICD-10-CM | POA: Diagnosis not present

## 2023-05-21 DIAGNOSIS — R051 Acute cough: Secondary | ICD-10-CM | POA: Diagnosis not present

## 2023-05-30 DIAGNOSIS — E1169 Type 2 diabetes mellitus with other specified complication: Secondary | ICD-10-CM | POA: Diagnosis not present

## 2023-05-30 DIAGNOSIS — Z125 Encounter for screening for malignant neoplasm of prostate: Secondary | ICD-10-CM | POA: Diagnosis not present

## 2023-05-30 DIAGNOSIS — E538 Deficiency of other specified B group vitamins: Secondary | ICD-10-CM | POA: Diagnosis not present

## 2023-06-07 DIAGNOSIS — E7800 Pure hypercholesterolemia, unspecified: Secondary | ICD-10-CM | POA: Diagnosis not present

## 2023-06-07 DIAGNOSIS — Z6828 Body mass index (BMI) 28.0-28.9, adult: Secondary | ICD-10-CM | POA: Diagnosis not present

## 2023-06-07 DIAGNOSIS — I10 Essential (primary) hypertension: Secondary | ICD-10-CM | POA: Diagnosis not present

## 2023-06-07 DIAGNOSIS — E538 Deficiency of other specified B group vitamins: Secondary | ICD-10-CM | POA: Diagnosis not present

## 2023-06-07 DIAGNOSIS — E663 Overweight: Secondary | ICD-10-CM | POA: Diagnosis not present

## 2023-07-05 DIAGNOSIS — R69 Illness, unspecified: Secondary | ICD-10-CM | POA: Diagnosis not present

## 2023-07-05 DIAGNOSIS — Z6828 Body mass index (BMI) 28.0-28.9, adult: Secondary | ICD-10-CM | POA: Diagnosis not present

## 2023-07-05 DIAGNOSIS — J309 Allergic rhinitis, unspecified: Secondary | ICD-10-CM | POA: Diagnosis not present

## 2023-08-05 ENCOUNTER — Other Ambulatory Visit: Payer: Self-pay | Admitting: Allergy

## 2023-10-11 DIAGNOSIS — L821 Other seborrheic keratosis: Secondary | ICD-10-CM | POA: Diagnosis not present

## 2023-10-11 DIAGNOSIS — D225 Melanocytic nevi of trunk: Secondary | ICD-10-CM | POA: Diagnosis not present

## 2023-10-11 DIAGNOSIS — L570 Actinic keratosis: Secondary | ICD-10-CM | POA: Diagnosis not present

## 2023-10-11 DIAGNOSIS — L814 Other melanin hyperpigmentation: Secondary | ICD-10-CM | POA: Diagnosis not present

## 2023-10-14 ENCOUNTER — Other Ambulatory Visit: Payer: Self-pay | Admitting: Allergy

## 2023-10-21 DIAGNOSIS — R110 Nausea: Secondary | ICD-10-CM | POA: Diagnosis not present

## 2023-10-21 DIAGNOSIS — H6693 Otitis media, unspecified, bilateral: Secondary | ICD-10-CM | POA: Diagnosis not present

## 2023-10-21 DIAGNOSIS — R42 Dizziness and giddiness: Secondary | ICD-10-CM | POA: Diagnosis not present

## 2023-11-02 DIAGNOSIS — H8111 Benign paroxysmal vertigo, right ear: Secondary | ICD-10-CM | POA: Diagnosis not present

## 2023-11-08 ENCOUNTER — Other Ambulatory Visit (HOSPITAL_COMMUNITY): Payer: Self-pay

## 2023-11-08 ENCOUNTER — Other Ambulatory Visit (HOSPITAL_BASED_OUTPATIENT_CLINIC_OR_DEPARTMENT_OTHER): Payer: Self-pay

## 2023-11-09 ENCOUNTER — Other Ambulatory Visit: Payer: Self-pay

## 2023-11-09 ENCOUNTER — Other Ambulatory Visit (HOSPITAL_COMMUNITY): Payer: Self-pay

## 2023-11-11 ENCOUNTER — Other Ambulatory Visit (HOSPITAL_COMMUNITY): Payer: Self-pay

## 2023-11-14 ENCOUNTER — Other Ambulatory Visit (HOSPITAL_COMMUNITY): Payer: Self-pay

## 2023-11-15 ENCOUNTER — Other Ambulatory Visit (HOSPITAL_COMMUNITY): Payer: Self-pay

## 2023-11-17 ENCOUNTER — Other Ambulatory Visit (HOSPITAL_COMMUNITY): Payer: Self-pay

## 2023-11-24 ENCOUNTER — Other Ambulatory Visit (HOSPITAL_COMMUNITY): Payer: Self-pay

## 2023-11-24 ENCOUNTER — Other Ambulatory Visit: Payer: Self-pay

## 2023-11-24 MED ORDER — LOSARTAN POTASSIUM 100 MG PO TABS
100.0000 mg | ORAL_TABLET | Freq: Every day | ORAL | 0 refills | Status: DC
  Filled 2023-11-24: qty 30, 30d supply, fill #0
  Filled 2023-11-24 – 2023-11-28 (×2): qty 90, 90d supply, fill #0
  Filled 2023-11-28 – 2023-12-01 (×2): qty 30, 30d supply, fill #0
  Filled 2023-12-26 (×3): qty 30, 30d supply, fill #1
  Filled 2024-01-05 – 2024-01-18 (×3): qty 30, 30d supply, fill #2

## 2023-11-24 MED ORDER — VITAMIN B 12 500 MCG PO TABS
500.0000 ug | ORAL_TABLET | Freq: Every day | ORAL | 3 refills | Status: DC
  Filled 2024-01-05: qty 100, fill #0
  Filled 2024-01-11 – 2024-01-18 (×2): qty 30, 30d supply, fill #0
  Filled 2024-01-23 – 2024-02-14 (×2): qty 30, 30d supply, fill #1
  Filled 2024-03-05 – 2024-03-08 (×2): qty 30, 30d supply, fill #2
  Filled 2024-03-21 – 2024-04-10 (×3): qty 30, 30d supply, fill #3
  Filled 2024-04-30 – 2024-05-10 (×2): qty 30, 30d supply, fill #4

## 2023-11-24 MED ORDER — HYDROCHLOROTHIAZIDE 25 MG PO TABS
25.0000 mg | ORAL_TABLET | Freq: Every morning | ORAL | 0 refills | Status: DC
  Filled 2023-11-24: qty 90, 90d supply, fill #0
  Filled 2023-11-24 – 2023-11-28 (×2): qty 30, 30d supply, fill #0
  Filled 2023-11-28: qty 90, 90d supply, fill #0
  Filled 2023-12-01: qty 30, 30d supply, fill #0
  Filled 2023-12-26 (×3): qty 30, 30d supply, fill #1
  Filled 2024-01-11 – 2024-01-18 (×2): qty 30, 30d supply, fill #2

## 2023-11-24 MED ORDER — METOPROLOL SUCCINATE ER 50 MG PO TB24
50.0000 mg | ORAL_TABLET | Freq: Every day | ORAL | 0 refills | Status: DC
  Filled 2024-01-11: qty 90, 90d supply, fill #0
  Filled 2024-01-18: qty 30, 30d supply, fill #0
  Filled 2024-01-23: qty 30, 30d supply, fill #1

## 2023-11-24 MED ORDER — AMLODIPINE BESYLATE 5 MG PO TABS: 5.0000 mg | ORAL_TABLET | Freq: Every day | ORAL | 2 refills | Status: DC

## 2023-11-24 MED ORDER — ASPIRIN 81 MG PO TBEC
81.0000 mg | DELAYED_RELEASE_TABLET | Freq: Every day | ORAL | 11 refills | Status: DC
  Filled 2024-01-05 – 2024-03-08 (×5): qty 30, 30d supply, fill #0
  Filled 2024-03-21 – 2024-04-10 (×2): qty 30, 30d supply, fill #1
  Filled 2024-04-30 – 2024-05-10 (×2): qty 30, 30d supply, fill #2

## 2023-11-24 MED ORDER — METFORMIN HCL 1000 MG PO TABS
1000.0000 mg | ORAL_TABLET | Freq: Two times a day (BID) | ORAL | 0 refills | Status: DC
  Filled 2023-11-24: qty 60, 30d supply, fill #0
  Filled 2023-11-24: qty 180, 90d supply, fill #0
  Filled 2023-11-28: qty 60, 30d supply, fill #0
  Filled 2023-11-28: qty 180, 90d supply, fill #0
  Filled 2023-12-01: qty 60, 30d supply, fill #0
  Filled 2023-12-26 (×3): qty 60, 30d supply, fill #1
  Filled 2024-01-05 – 2024-01-18 (×3): qty 60, 30d supply, fill #2

## 2023-11-24 MED ORDER — ATORVASTATIN CALCIUM 40 MG PO TABS
40.0000 mg | ORAL_TABLET | ORAL | 0 refills | Status: DC
  Filled 2023-12-29 – 2024-02-28 (×3): qty 90, 90d supply, fill #0

## 2023-11-25 ENCOUNTER — Other Ambulatory Visit (HOSPITAL_COMMUNITY): Payer: Self-pay

## 2023-11-28 ENCOUNTER — Other Ambulatory Visit (HOSPITAL_BASED_OUTPATIENT_CLINIC_OR_DEPARTMENT_OTHER): Payer: Self-pay

## 2023-11-28 ENCOUNTER — Other Ambulatory Visit (HOSPITAL_COMMUNITY): Payer: Self-pay

## 2023-11-28 ENCOUNTER — Other Ambulatory Visit: Payer: Self-pay

## 2023-11-28 MED ORDER — ONETOUCH VERIO FLEX SYSTEM W/DEVICE KIT
PACK | 0 refills | Status: AC
  Filled 2023-11-28 – 2024-02-28 (×2): qty 1, 30d supply, fill #0

## 2023-11-28 MED ORDER — ATORVASTATIN CALCIUM 40 MG PO TABS
40.0000 mg | ORAL_TABLET | Freq: Every day | ORAL | 0 refills | Status: DC
  Filled 2023-11-28 – 2023-12-01 (×3): qty 30, 30d supply, fill #0
  Filled 2023-12-26 (×3): qty 30, 30d supply, fill #1

## 2023-11-28 MED ORDER — VITAMIN B-12 500 MCG PO TABS
500.0000 ug | ORAL_TABLET | Freq: Every day | ORAL | 3 refills | Status: AC
  Filled 2023-11-28: qty 30, 30d supply, fill #0
  Filled 2023-11-28: qty 100, 100d supply, fill #0
  Filled 2023-12-01: qty 30, 30d supply, fill #0
  Filled 2023-12-26 (×3): qty 30, 30d supply, fill #1

## 2023-11-28 MED ORDER — HYDROCHLOROTHIAZIDE 25 MG PO TABS
25.0000 mg | ORAL_TABLET | ORAL | 1 refills | Status: AC
  Filled 2023-11-28 (×2): qty 30, 30d supply, fill #0
  Filled 2023-12-01 – 2024-01-05 (×2): qty 90, 90d supply, fill #0
  Filled 2024-02-14: qty 30, 30d supply, fill #0
  Filled 2024-02-27 – 2024-03-08 (×3): qty 30, 30d supply, fill #1

## 2023-11-28 MED ORDER — AMLODIPINE BESYLATE 5 MG PO TABS
5.0000 mg | ORAL_TABLET | Freq: Every day | ORAL | 0 refills | Status: DC
  Filled 2023-11-28 – 2023-12-01 (×3): qty 30, 30d supply, fill #0
  Filled 2023-12-26 (×3): qty 30, 30d supply, fill #1

## 2023-11-28 MED ORDER — GLUCOSE BLOOD VI STRP
ORAL_STRIP | 1 refills | Status: DC
  Filled 2023-11-28: qty 100, 90d supply, fill #0

## 2023-11-28 MED ORDER — METFORMIN HCL 1000 MG PO TABS
1000.0000 mg | ORAL_TABLET | Freq: Two times a day (BID) | ORAL | 1 refills | Status: DC
  Filled 2023-11-28 (×2): qty 60, 30d supply, fill #0
  Filled 2023-12-01 – 2024-01-05 (×2): qty 180, 90d supply, fill #0
  Filled 2024-02-14: qty 60, 30d supply, fill #0
  Filled 2024-02-27 – 2024-03-08 (×3): qty 60, 30d supply, fill #1

## 2023-11-28 MED ORDER — AZELASTINE HCL 137 MCG/SPRAY NA SOLN
NASAL | 10 refills | Status: AC
  Filled 2023-11-28 (×2): qty 30, 30d supply, fill #0
  Filled 2023-12-01: qty 30, 50d supply, fill #0
  Filled 2024-02-24: qty 30, 25d supply, fill #0
  Filled 2024-10-10: qty 30, 25d supply, fill #1

## 2023-11-28 MED ORDER — ONETOUCH DELICA LANCETS 33G MISC
5 refills | Status: AC
  Filled 2023-11-28: qty 100, 90d supply, fill #0
  Filled 2024-01-05: qty 100, fill #0
  Filled 2024-02-28: qty 100, 100d supply, fill #0

## 2023-11-28 MED ORDER — LEVOCETIRIZINE DIHYDROCHLORIDE 5 MG PO TABS
5.0000 mg | ORAL_TABLET | Freq: Every evening | ORAL | 1 refills | Status: AC
  Filled 2023-11-28 – 2024-01-05 (×4): qty 30, 30d supply, fill #0

## 2023-11-28 MED ORDER — METOPROLOL SUCCINATE ER 50 MG PO TB24
50.0000 mg | ORAL_TABLET | Freq: Every day | ORAL | 1 refills | Status: DC
  Filled 2023-11-28 (×2): qty 30, 30d supply, fill #0
  Filled 2023-12-01: qty 90, 90d supply, fill #0
  Filled 2023-12-01: qty 30, 30d supply, fill #0
  Filled 2023-12-26 (×3): qty 30, 30d supply, fill #1

## 2023-11-28 MED ORDER — LOSARTAN POTASSIUM 100 MG PO TABS
100.0000 mg | ORAL_TABLET | Freq: Every day | ORAL | 1 refills | Status: DC
  Filled 2023-11-28 (×2): qty 30, 30d supply, fill #0
  Filled 2023-12-01: qty 90, 90d supply, fill #0

## 2023-11-28 MED ORDER — ONETOUCH VERIO VI STRP
ORAL_STRIP | 5 refills | Status: DC
  Filled 2023-11-28 – 2023-12-05 (×2): qty 100, 90d supply, fill #0
  Filled 2024-02-24: qty 100, 90d supply, fill #1
  Filled 2024-06-18: qty 100, 100d supply, fill #2

## 2023-11-28 MED ORDER — ASPIRIN 81 MG PO TBEC
81.0000 mg | DELAYED_RELEASE_TABLET | ORAL | 0 refills | Status: DC
  Filled 2023-11-28 – 2023-12-01 (×4): qty 30, 30d supply, fill #0
  Filled 2023-12-26 (×3): qty 30, 30d supply, fill #1
  Filled 2024-01-11 – 2024-01-18 (×2): qty 30, 30d supply, fill #2
  Filled 2024-01-23 – 2024-02-14 (×2): qty 30, 30d supply, fill #3

## 2023-12-01 ENCOUNTER — Other Ambulatory Visit: Payer: Self-pay

## 2023-12-01 ENCOUNTER — Other Ambulatory Visit (HOSPITAL_COMMUNITY): Payer: Self-pay

## 2023-12-02 ENCOUNTER — Other Ambulatory Visit: Payer: Self-pay

## 2023-12-05 ENCOUNTER — Other Ambulatory Visit (HOSPITAL_COMMUNITY): Payer: Self-pay

## 2023-12-05 ENCOUNTER — Encounter (HOSPITAL_COMMUNITY): Payer: Self-pay

## 2023-12-06 ENCOUNTER — Other Ambulatory Visit (HOSPITAL_COMMUNITY): Payer: Self-pay

## 2023-12-09 ENCOUNTER — Other Ambulatory Visit (HOSPITAL_COMMUNITY): Payer: Self-pay

## 2023-12-12 ENCOUNTER — Other Ambulatory Visit (HOSPITAL_COMMUNITY): Payer: Self-pay

## 2023-12-26 ENCOUNTER — Other Ambulatory Visit: Payer: Self-pay

## 2023-12-29 ENCOUNTER — Other Ambulatory Visit: Payer: Self-pay

## 2023-12-29 ENCOUNTER — Other Ambulatory Visit (HOSPITAL_COMMUNITY): Payer: Self-pay

## 2023-12-29 MED ORDER — METOPROLOL SUCCINATE ER 50 MG PO TB24
50.0000 mg | ORAL_TABLET | Freq: Every day | ORAL | 1 refills | Status: DC
  Filled 2024-02-14: qty 30, 30d supply, fill #0
  Filled 2024-03-05 – 2024-03-08 (×2): qty 30, 30d supply, fill #1
  Filled 2024-03-21 – 2024-04-10 (×2): qty 30, 30d supply, fill #2
  Filled 2024-04-30 – 2024-05-29 (×3): qty 30, 30d supply, fill #3
  Filled 2024-06-11 – 2024-06-20 (×3): qty 30, 30d supply, fill #4
  Filled 2024-07-16 – 2024-07-18 (×3): qty 30, 30d supply, fill #5

## 2023-12-29 MED ORDER — LOSARTAN POTASSIUM 100 MG PO TABS
100.0000 mg | ORAL_TABLET | Freq: Every day | ORAL | 1 refills | Status: DC
  Filled 2024-02-14: qty 30, 30d supply, fill #0
  Filled 2024-03-05 – 2024-03-08 (×2): qty 30, 30d supply, fill #1
  Filled 2024-03-21 – 2024-04-10 (×2): qty 30, 30d supply, fill #2
  Filled 2024-04-30 – 2024-05-11 (×3): qty 30, 30d supply, fill #3
  Filled 2024-06-11 – 2024-06-20 (×3): qty 30, 30d supply, fill #4
  Filled 2024-07-16 – 2024-07-18 (×3): qty 30, 30d supply, fill #5

## 2023-12-30 ENCOUNTER — Other Ambulatory Visit (HOSPITAL_COMMUNITY): Payer: Self-pay

## 2024-01-05 ENCOUNTER — Other Ambulatory Visit (HOSPITAL_COMMUNITY): Payer: Self-pay

## 2024-01-05 ENCOUNTER — Other Ambulatory Visit: Payer: Self-pay

## 2024-01-05 MED ORDER — AMLODIPINE BESYLATE 5 MG PO TABS
5.0000 mg | ORAL_TABLET | Freq: Every day | ORAL | 0 refills | Status: DC
  Filled 2024-01-05: qty 90, 90d supply, fill #0
  Filled 2024-01-18: qty 30, 30d supply, fill #0
  Filled 2024-01-23 – 2024-02-14 (×2): qty 30, 30d supply, fill #1
  Filled 2024-02-27 – 2024-03-08 (×3): qty 30, 30d supply, fill #2

## 2024-01-05 MED ORDER — ATORVASTATIN CALCIUM 40 MG PO TABS
40.0000 mg | ORAL_TABLET | Freq: Every day | ORAL | 3 refills | Status: AC
  Filled 2024-01-05: qty 90, 90d supply, fill #0
  Filled 2024-01-18: qty 30, 30d supply, fill #0
  Filled 2024-01-23 – 2024-02-14 (×2): qty 30, 30d supply, fill #1
  Filled 2024-03-05 – 2024-03-08 (×2): qty 30, 30d supply, fill #2
  Filled 2024-03-21 – 2024-04-10 (×2): qty 30, 30d supply, fill #3
  Filled 2024-04-30 – 2024-05-11 (×3): qty 30, 30d supply, fill #4
  Filled 2024-06-11 – 2024-06-20 (×3): qty 30, 30d supply, fill #5
  Filled 2024-07-16 – 2024-07-18 (×3): qty 30, 30d supply, fill #6
  Filled 2024-08-16 – 2024-08-21 (×2): qty 30, 30d supply, fill #7
  Filled 2024-09-12 – 2024-09-18 (×3): qty 30, 30d supply, fill #8
  Filled 2024-10-15: qty 30, 30d supply, fill #9
  Filled 2024-11-14: qty 30, 30d supply, fill #10
  Filled 2024-12-17: qty 30, 30d supply, fill #11

## 2024-01-06 ENCOUNTER — Other Ambulatory Visit (HOSPITAL_COMMUNITY): Payer: Self-pay

## 2024-01-06 MED ORDER — ATORVASTATIN CALCIUM 40 MG PO TABS
40.0000 mg | ORAL_TABLET | Freq: Every day | ORAL | 3 refills | Status: DC
  Filled 2024-02-27: qty 90, 90d supply, fill #0

## 2024-01-06 MED ORDER — AMLODIPINE BESYLATE 5 MG PO TABS
5.0000 mg | ORAL_TABLET | Freq: Every day | ORAL | 1 refills | Status: DC
  Filled 2024-02-27: qty 90, 90d supply, fill #0
  Filled 2024-05-10 – 2024-05-11 (×2): qty 30, 30d supply, fill #0
  Filled 2024-06-11 – 2024-06-20 (×3): qty 30, 30d supply, fill #1
  Filled 2024-07-16 – 2024-07-18 (×3): qty 30, 30d supply, fill #2
  Filled 2024-08-16 – 2024-08-21 (×2): qty 30, 30d supply, fill #3
  Filled 2024-09-12 – 2024-09-18 (×3): qty 30, 30d supply, fill #4
  Filled 2024-10-15: qty 30, 30d supply, fill #5

## 2024-01-11 ENCOUNTER — Other Ambulatory Visit: Payer: Self-pay

## 2024-01-12 ENCOUNTER — Other Ambulatory Visit: Payer: Self-pay

## 2024-01-18 ENCOUNTER — Other Ambulatory Visit: Payer: Self-pay

## 2024-01-18 ENCOUNTER — Other Ambulatory Visit (HOSPITAL_COMMUNITY): Payer: Self-pay

## 2024-01-23 ENCOUNTER — Other Ambulatory Visit: Payer: Self-pay

## 2024-02-08 ENCOUNTER — Other Ambulatory Visit (HOSPITAL_COMMUNITY): Payer: Self-pay

## 2024-02-14 ENCOUNTER — Other Ambulatory Visit: Payer: Self-pay

## 2024-02-14 ENCOUNTER — Other Ambulatory Visit (HOSPITAL_COMMUNITY): Payer: Self-pay

## 2024-02-15 ENCOUNTER — Other Ambulatory Visit: Payer: Self-pay

## 2024-02-15 DIAGNOSIS — L570 Actinic keratosis: Secondary | ICD-10-CM | POA: Diagnosis not present

## 2024-02-24 ENCOUNTER — Other Ambulatory Visit: Payer: Self-pay

## 2024-02-24 ENCOUNTER — Other Ambulatory Visit (HOSPITAL_COMMUNITY): Payer: Self-pay

## 2024-02-27 ENCOUNTER — Other Ambulatory Visit: Payer: Self-pay

## 2024-02-27 ENCOUNTER — Other Ambulatory Visit (HOSPITAL_COMMUNITY): Payer: Self-pay

## 2024-02-27 MED ORDER — HYDROCHLOROTHIAZIDE 25 MG PO TABS
25.0000 mg | ORAL_TABLET | Freq: Every morning | ORAL | 0 refills | Status: DC
  Filled 2024-02-27 – 2024-02-28 (×2): qty 90, 90d supply, fill #0

## 2024-02-27 MED ORDER — METFORMIN HCL 1000 MG PO TABS
1000.0000 mg | ORAL_TABLET | Freq: Two times a day (BID) | ORAL | 0 refills | Status: DC
Start: 2024-02-27 — End: 2024-05-09
  Filled 2024-02-27 – 2024-02-28 (×2): qty 180, 90d supply, fill #0

## 2024-02-28 ENCOUNTER — Other Ambulatory Visit: Payer: Self-pay

## 2024-02-28 ENCOUNTER — Other Ambulatory Visit (HOSPITAL_COMMUNITY): Payer: Self-pay

## 2024-02-29 ENCOUNTER — Other Ambulatory Visit: Payer: Self-pay

## 2024-03-05 ENCOUNTER — Other Ambulatory Visit: Payer: Self-pay

## 2024-03-08 ENCOUNTER — Other Ambulatory Visit (HOSPITAL_COMMUNITY): Payer: Self-pay

## 2024-03-08 ENCOUNTER — Other Ambulatory Visit: Payer: Self-pay

## 2024-03-09 ENCOUNTER — Other Ambulatory Visit: Payer: Self-pay

## 2024-03-13 DIAGNOSIS — H903 Sensorineural hearing loss, bilateral: Secondary | ICD-10-CM | POA: Diagnosis not present

## 2024-03-21 ENCOUNTER — Other Ambulatory Visit: Payer: Self-pay

## 2024-03-21 ENCOUNTER — Other Ambulatory Visit (HOSPITAL_COMMUNITY): Payer: Self-pay

## 2024-03-21 MED ORDER — METFORMIN HCL 1000 MG PO TABS
1000.0000 mg | ORAL_TABLET | Freq: Two times a day (BID) | ORAL | 0 refills | Status: DC
  Filled 2024-04-10: qty 60, 30d supply, fill #0
  Filled 2024-04-30 – 2024-05-11 (×3): qty 60, 30d supply, fill #1
  Filled 2024-06-11 – 2024-06-20 (×3): qty 60, 30d supply, fill #2

## 2024-03-21 MED ORDER — AMLODIPINE BESYLATE 5 MG PO TABS
5.0000 mg | ORAL_TABLET | Freq: Every day | ORAL | 0 refills | Status: DC
Start: 2024-03-21 — End: 2024-05-09
  Filled 2024-04-10: qty 30, 30d supply, fill #0
  Filled 2024-04-30 – 2024-05-10 (×2): qty 30, 30d supply, fill #1

## 2024-03-21 MED ORDER — HYDROCHLOROTHIAZIDE 25 MG PO TABS
25.0000 mg | ORAL_TABLET | Freq: Every morning | ORAL | 0 refills | Status: DC
  Filled 2024-04-10: qty 30, 30d supply, fill #0
  Filled 2024-04-30 – 2024-05-10 (×2): qty 30, 30d supply, fill #1

## 2024-03-22 ENCOUNTER — Other Ambulatory Visit (HOSPITAL_COMMUNITY): Payer: Self-pay

## 2024-04-06 ENCOUNTER — Other Ambulatory Visit (HOSPITAL_COMMUNITY): Payer: Self-pay

## 2024-04-09 ENCOUNTER — Other Ambulatory Visit (HOSPITAL_COMMUNITY): Payer: Self-pay

## 2024-04-10 ENCOUNTER — Other Ambulatory Visit: Payer: Self-pay

## 2024-04-10 DIAGNOSIS — E538 Deficiency of other specified B group vitamins: Secondary | ICD-10-CM | POA: Diagnosis not present

## 2024-04-10 DIAGNOSIS — K5730 Diverticulosis of large intestine without perforation or abscess without bleeding: Secondary | ICD-10-CM | POA: Diagnosis not present

## 2024-04-10 DIAGNOSIS — E1169 Type 2 diabetes mellitus with other specified complication: Secondary | ICD-10-CM | POA: Diagnosis not present

## 2024-04-10 DIAGNOSIS — I700 Atherosclerosis of aorta: Secondary | ICD-10-CM | POA: Diagnosis not present

## 2024-04-10 DIAGNOSIS — J309 Allergic rhinitis, unspecified: Secondary | ICD-10-CM | POA: Diagnosis not present

## 2024-04-10 DIAGNOSIS — Z0000 Encounter for general adult medical examination without abnormal findings: Secondary | ICD-10-CM | POA: Diagnosis not present

## 2024-04-10 DIAGNOSIS — Z125 Encounter for screening for malignant neoplasm of prostate: Secondary | ICD-10-CM | POA: Diagnosis not present

## 2024-04-10 DIAGNOSIS — E119 Type 2 diabetes mellitus without complications: Secondary | ICD-10-CM | POA: Diagnosis not present

## 2024-04-10 DIAGNOSIS — Z23 Encounter for immunization: Secondary | ICD-10-CM | POA: Diagnosis not present

## 2024-04-10 DIAGNOSIS — I10 Essential (primary) hypertension: Secondary | ICD-10-CM | POA: Diagnosis not present

## 2024-04-10 DIAGNOSIS — Z6828 Body mass index (BMI) 28.0-28.9, adult: Secondary | ICD-10-CM | POA: Diagnosis not present

## 2024-04-20 ENCOUNTER — Other Ambulatory Visit: Payer: Self-pay

## 2024-04-30 ENCOUNTER — Other Ambulatory Visit: Payer: Self-pay

## 2024-05-08 ENCOUNTER — Other Ambulatory Visit: Payer: Self-pay

## 2024-05-08 DIAGNOSIS — N3943 Post-void dribbling: Secondary | ICD-10-CM

## 2024-05-09 ENCOUNTER — Other Ambulatory Visit
Admission: RE | Admit: 2024-05-09 | Discharge: 2024-05-09 | Disposition: A | Discharge status: Home / Self Care | Attending: Urology | Admitting: Urology

## 2024-05-09 ENCOUNTER — Ambulatory Visit: Admitting: Urology

## 2024-05-09 ENCOUNTER — Other Ambulatory Visit: Payer: Self-pay

## 2024-05-09 ENCOUNTER — Other Ambulatory Visit (HOSPITAL_COMMUNITY): Payer: Self-pay

## 2024-05-09 VITALS — BP 149/87 | HR 67 | Ht 67.0 in | Wt 183.4 lb

## 2024-05-09 DIAGNOSIS — N3943 Post-void dribbling: Secondary | ICD-10-CM | POA: Insufficient documentation

## 2024-05-09 DIAGNOSIS — N401 Enlarged prostate with lower urinary tract symptoms: Secondary | ICD-10-CM

## 2024-05-09 DIAGNOSIS — Z125 Encounter for screening for malignant neoplasm of prostate: Secondary | ICD-10-CM | POA: Diagnosis not present

## 2024-05-09 DIAGNOSIS — N529 Male erectile dysfunction, unspecified: Secondary | ICD-10-CM

## 2024-05-09 LAB — URINALYSIS, COMPLETE (UACMP) WITH MICROSCOPIC
Bilirubin Urine: NEGATIVE
Glucose, UA: NEGATIVE mg/dL
Hgb urine dipstick: NEGATIVE
Leukocytes,Ua: NEGATIVE
Nitrite: NEGATIVE
Protein, ur: NEGATIVE mg/dL
Specific Gravity, Urine: 1.015 (ref 1.005–1.030)
pH: 7 (ref 5.0–8.0)

## 2024-05-09 LAB — BLADDER SCAN AMB NON-IMAGING

## 2024-05-09 MED ORDER — TADALAFIL 5 MG PO TABS
5.0000 mg | ORAL_TABLET | Freq: Every day | ORAL | 11 refills | Status: DC
  Filled 2024-05-09 – 2024-05-15 (×3): qty 30, 30d supply, fill #0
  Filled 2024-06-04: qty 30, 30d supply, fill #1
  Filled 2024-06-11 – 2024-06-13 (×2): qty 30, 30d supply, fill #2
  Filled ????-??-??: fill #2

## 2024-05-09 NOTE — Progress Notes (Signed)
   05/09/24 1:56 PM   Edward Moore Jun 24, 1951 [id number]  CC: Urinary symptoms, ED, PSA screening  HPI: 73 year old male who presents with at least 6 months of worsening urinary symptoms.  Primary complaint is urgency, occasional urge incontinence, postvoid dribbling, and nocturia 2-3 times at night.  He drinks primarily water during the day, however 4 alcoholic drinks in the evening.  He has never tried medications for this.  Urinalysis today normal, PVR normal at .  Recent PSA with PCP normal at 1.46.  Renal function normal.  He denies any dysuria or gross hematuria.  He also has some problems maintaining erections.  IPSS score today 13, quality-of-life mixed.  PMH: Past Medical History:  Diagnosis Date   Hypertension    SCCA (squamous cell carcinoma) of skin 05/16/2018   Right Hand (curet and 5FU)   SCCA (squamous cell carcinoma) of skin 05/16/2018   Left Scalp (in situ) (curet and 5FU)   SCCA (squamous cell carcinoma) of skin 04/29/2020   Mid Forehead (in situ)    Surgical History: Past Surgical History:  Procedure Laterality Date   LIMB SPARING RESECTION HIP W/ SADDLE JOINT REPLACEMENT  2021   Subarachnoid hemorrhage  1998   X-STOP IMPLANTATION  2013    Family History: Family History  Problem Relation Age of Onset   Allergic rhinitis Mother    Allergic rhinitis Brother     Social History:  reports that he has never smoked. He has never been exposed to tobacco smoke. He has never used smokeless tobacco. He reports current alcohol use of about 21.0 standard drinks of alcohol per week. He reports that he does not use drugs.  Physical Exam: BP (!) 149/87 (BP Location: Left Arm, Patient Position: Sitting, Cuff Size: Normal)   Pulse 67   Ht 5' 7 (1.702 m)   Wt 183 lb 6.4 oz (83.2 kg)   SpO2 99%   BMI 28.72 kg/m    Constitutional:  Alert and oriented, No acute distress. Cardiovascular: No clubbing, cyanosis, or edema. Respiratory: Normal respiratory effort,  no increased work of breathing. GI: Abdomen is soft, nontender, nondistended, no abdominal masses   Laboratory Data: Reviewed, see HPI  Assessment & Plan:   73 year old male here today for urinary symptoms with urgency, nocturia 2-3 times at night, postvoid dribbling, as well as ED.  He consumes a large amount of alcohol in the evening which likely contributes to his nocturia and behavioral strategies were discussed.  He was interested in a trial of Cialis to address both his mild urinary symptoms and ED.  Risk and benefits were discussed.  Could also consider Flomax in the future if no improvement in urinary symptoms with the Cialis.  Reassurance provided regarding benign urinalysis and PSA.  Trial of Cialis 5 mg daily for BPH and ED RTC 6 weeks IPSS, PVR, symptom check   Jay Meth, MD 05/09/2024  [facility] [address] [place], [place] [address] [phone]

## 2024-05-09 NOTE — Patient Instructions (Signed)
Tadalafil Tablets (Erectile Dysfunction, BPH) What is this medication? TADALAFIL (tah DA la fil) treats erectile dysfunction (ED). It works by increasing blood flow to the penis, which helps to maintain an erection. It may also be used to treat symptoms of an enlarged prostate (benign prostatic hyperplasia). This medicine may be used for other purposes; ask your health care provider or pharmacist if you have questions. COMMON BRAND NAME(S): Mady Gemma, Cialis What should I tell my care team before I take this medication? They need to know if you have any of these conditions: Abnormal penis shape or Peyronie disease Bleeding disorder Blood diseases, such as sickle cell anemia or leukemia Eye disease, such as retinitis pigmentosa Have had a heart attack Have had a painful and prolonged erection Have had a stroke Heart disease, such as angina, heart failure, irregular heartbeat or rhythm High or low blood pressure Stomach ulcers, other stomach or intestine problems Kidney disease Liver disease An unusual or allergic reaction to tadalafil, other medications, foods, dyes, or preservatives Pregnant or trying to get pregnant Breastfeeding How should I use this medication? Take this medication by mouth with a glass of water. Follow the directions on the prescription label. You may take this medication with or without meals. When this medication is used for erection problems, your care team may prescribe it to be taken once daily or as needed. If you are taking the medication as needed, you may be able to have sexual activity 30 minutes after taking it and for up to 36 hours after taking it. Whether you are taking the medication as needed or once daily, you should not take more than one dose per day. If you are taking this medication for symptoms of benign prostatic hyperplasia (BPH) or to treat both BPH and an erection problem, take the dose once daily at about the same time each day. Do not take  your medication more often than directed. Talk to your care team about the use of this medication in children. Special care may be needed. Overdosage: If you think you have taken too much of this medicine contact a poison control center or emergency room at once. NOTE: This medicine is only for you. Do not share this medicine with others. What if I miss a dose? If you are taking this medication as needed for erection problems, this does not apply. If you miss a dose while taking this medication once daily for an erection problem, benign prostatic hyperplasia, or both, take it as soon as you remember, but do not take more than one dose per day. What may interact with this medication? Do not take this medication with any of the following: Nitrates, such as amyl nitrite, isosorbide dinitrate, isosorbide mononitrate, nitroglycerin Other medications for erectile dysfunction, such as avanafil, sildenafil, vardenafil Other tadalafil products Riociguat Vericiguat This medication may also interact with the following: Alcohol Certain antibiotics, such as clarithromycin or erythromycin Certain antivirals for HIV or hepatitis Certain medications for blood pressure Certain medications for fungal infections, such as fluconazole, itraconazole, ketoconazole, voriconazole Certain medications for seizures, such as carbamazepine, phenytoin, phenobarbital Grapefruit juice Medications for prostate problems Rifabutin, rifampin, or rifapentine Other medications may affect the way this medication works. Talk with your care team about all of the medications you take. They may suggest changes to your treatment plan to lower the risk of side effects and to make sure your medications work as intended. This list may not describe all possible interactions. Give your health care provider a  list of all the medicines, herbs, non-prescription drugs, or dietary supplements you use. Also tell them if you smoke, drink alcohol,  or use illegal drugs. Some items may interact with your medicine. What should I watch for while using this medication? Visit your care team for regular checks on your progress. Tell your care team if your symptoms do not start to get better or if they get worse. Using this medication does not protect you or your partner against HIV or other sexually transmitted infections (STIs). Stop and call your care team right away if you have symptoms such as nausea, dizziness, or chest pain during sex. Contact your care team right away if you have an erection that lasts longer than 4 hours or if it becomes painful. This may be a sign of a serious problem and must be treated right away to prevent permanent damage. What side effects may I notice from receiving this medication? Side effects that you should report to your care team as soon as possible: Allergic reactions--skin rash, itching, hives, swelling of the face, lips, tongue, or throat Hearing loss or ringing in ears Heart attack--pain or tightness in the chest, shoulders, arms, or jaw, nausea, shortness of breath, cold or clammy skin, feeling faint or lightheaded Low blood pressure--dizziness, feeling faint or lightheaded, blurry vision Prolonged or painful erection Redness, blistering, peeling, or loosening of the skin, including inside the mouth Stroke--sudden numbness or weakness of the face, arm, or leg, trouble speaking, confusion, trouble walking, loss of balance or coordination, dizziness, severe headache, change in vision Sudden vision loss in one or both eyes Side effects that usually do not require medical attention (report to your care team if they continue or are bothersome): Back pain Facial flushing or redness Headache Muscle pain Runny or stuffy nose Upset stomach This list may not describe all possible side effects. Call your doctor for medical advice about side effects. You may report side effects to FDA at 1-800-FDA-1088. Where  should I keep my medication? Keep out of the reach of children. Store at room temperature between 15 and 30 degrees C (59 and 86 degrees F). Throw away any unused medication after the expiration date. NOTE: This sheet is a summary. It may not cover all possible information. If you have questions about this medicine, talk to your doctor, pharmacist, or health care provider.  2024 Elsevier/Gold Standard (2023-03-04 00:00:00)

## 2024-05-10 ENCOUNTER — Other Ambulatory Visit: Payer: Self-pay

## 2024-05-10 ENCOUNTER — Other Ambulatory Visit (HOSPITAL_COMMUNITY): Payer: Self-pay

## 2024-05-10 MED ORDER — ASPIRIN 81 MG PO TBEC
81.0000 mg | DELAYED_RELEASE_TABLET | Freq: Every day | ORAL | 1 refills | Status: DC
  Filled 2024-05-10: qty 100, 100d supply, fill #0
  Filled 2024-05-11: qty 30, 30d supply, fill #0
  Filled 2024-06-11 – 2024-06-20 (×3): qty 30, 30d supply, fill #1
  Filled 2024-07-16 – 2024-07-18 (×3): qty 30, 30d supply, fill #2
  Filled 2024-08-16 – 2024-08-21 (×2): qty 30, 30d supply, fill #3
  Filled 2024-09-12 – 2024-09-18 (×3): qty 30, 30d supply, fill #4
  Filled 2024-10-15: qty 30, 30d supply, fill #5
  Filled ????-??-??: fill #6

## 2024-05-10 MED ORDER — VITAMIN B 12 500 MCG PO TABS
1.0000 | ORAL_TABLET | Freq: Every day | ORAL | 0 refills | Status: DC
  Filled 2024-05-10: qty 90, 90d supply, fill #0
  Filled 2024-05-11: qty 30, 30d supply, fill #0
  Filled 2024-06-11 – 2024-06-20 (×3): qty 30, 30d supply, fill #1
  Filled 2024-07-16 – 2024-07-18 (×3): qty 30, 30d supply, fill #2

## 2024-05-10 MED ORDER — HYDROCHLOROTHIAZIDE 25 MG PO TABS
25.0000 mg | ORAL_TABLET | Freq: Every morning | ORAL | 0 refills | Status: DC
  Filled 2024-05-10: qty 90, 90d supply, fill #0
  Filled 2024-05-11: qty 30, 30d supply, fill #0
  Filled 2024-06-11 – 2024-06-20 (×3): qty 30, 30d supply, fill #1
  Filled 2024-07-16 – 2024-07-18 (×3): qty 30, 30d supply, fill #2

## 2024-05-11 ENCOUNTER — Other Ambulatory Visit: Payer: Self-pay

## 2024-05-14 ENCOUNTER — Other Ambulatory Visit (HOSPITAL_COMMUNITY): Payer: Self-pay

## 2024-05-14 ENCOUNTER — Other Ambulatory Visit: Payer: Self-pay

## 2024-05-15 ENCOUNTER — Other Ambulatory Visit: Payer: Self-pay

## 2024-05-15 ENCOUNTER — Other Ambulatory Visit (HOSPITAL_BASED_OUTPATIENT_CLINIC_OR_DEPARTMENT_OTHER): Payer: Self-pay

## 2024-05-21 ENCOUNTER — Other Ambulatory Visit: Payer: Self-pay

## 2024-05-25 ENCOUNTER — Other Ambulatory Visit (HOSPITAL_COMMUNITY): Payer: Self-pay

## 2024-05-29 ENCOUNTER — Other Ambulatory Visit: Payer: Self-pay

## 2024-05-29 ENCOUNTER — Other Ambulatory Visit (HOSPITAL_COMMUNITY): Payer: Self-pay

## 2024-06-04 ENCOUNTER — Other Ambulatory Visit (HOSPITAL_COMMUNITY): Payer: Self-pay

## 2024-06-05 ENCOUNTER — Other Ambulatory Visit: Payer: Self-pay

## 2024-06-11 ENCOUNTER — Other Ambulatory Visit: Payer: Self-pay

## 2024-06-13 ENCOUNTER — Other Ambulatory Visit: Payer: Self-pay

## 2024-06-18 ENCOUNTER — Other Ambulatory Visit (HOSPITAL_COMMUNITY): Payer: Self-pay

## 2024-06-18 ENCOUNTER — Other Ambulatory Visit: Payer: Self-pay

## 2024-06-19 ENCOUNTER — Ambulatory Visit: Admitting: Urology

## 2024-06-19 ENCOUNTER — Other Ambulatory Visit (HOSPITAL_COMMUNITY): Payer: Self-pay

## 2024-06-19 ENCOUNTER — Other Ambulatory Visit: Payer: Self-pay

## 2024-06-19 VITALS — BP 144/80 | HR 71 | Ht 66.0 in | Wt 183.6 lb

## 2024-06-19 DIAGNOSIS — N529 Male erectile dysfunction, unspecified: Secondary | ICD-10-CM | POA: Diagnosis not present

## 2024-06-19 DIAGNOSIS — R399 Unspecified symptoms and signs involving the genitourinary system: Secondary | ICD-10-CM

## 2024-06-19 DIAGNOSIS — N3943 Post-void dribbling: Secondary | ICD-10-CM

## 2024-06-19 DIAGNOSIS — N401 Enlarged prostate with lower urinary tract symptoms: Secondary | ICD-10-CM | POA: Diagnosis not present

## 2024-06-19 LAB — BLADDER SCAN AMB NON-IMAGING

## 2024-06-19 MED ORDER — TADALAFIL 5 MG PO TABS
5.0000 mg | ORAL_TABLET | Freq: Every day | ORAL | 3 refills | Status: AC
  Filled 2024-06-19: qty 90, 90d supply, fill #0
  Filled 2024-06-20: qty 30, 30d supply, fill #0
  Filled 2024-07-16 – 2024-07-18 (×3): qty 30, 30d supply, fill #1
  Filled 2024-08-16 – 2024-08-21 (×2): qty 30, 30d supply, fill #2
  Filled 2024-09-12 – 2024-09-18 (×3): qty 30, 30d supply, fill #3
  Filled 2024-10-15: qty 30, 30d supply, fill #4
  Filled 2024-11-14: qty 30, 30d supply, fill #5
  Filled 2024-12-17: qty 30, 30d supply, fill #6

## 2024-06-19 NOTE — Progress Notes (Signed)
   06/19/2024 1:56 PM   Edward Moore May 02, 1951 [id number]  Reason for visit: Follow up urinary symptoms, ED  HPI: 73 year old male who I originally saw in June 2025 for urinary symptoms of urgency, nocturia 3-5 times at night, postvoid dribbling as well as ED.  Consumes 4 alcoholic drinks in the evening which likely contributes to his nocturia.  We opted to trial 5 mg Cialis  daily for both BPH and ED.  PSA was normal at 1.46, PVR normal at , urinalysis benign, and renal function normal.  He reports significant improvement on the Cialis .  IPSS score 5, with quality-of-life mostly satisfied.  PVR 80ml.  ED improved significantly.  Overall very satisfied on that medication and would like to continue the daily 5 mg dose.  Cialis  refilled, RTC 1 year IPSS/PVR.  If doing well at that time can RTC as needed   [name], MD  [facility] [address] [place], [place] [address] [phone]

## 2024-06-20 ENCOUNTER — Other Ambulatory Visit (HOSPITAL_COMMUNITY): Payer: Self-pay

## 2024-06-20 ENCOUNTER — Other Ambulatory Visit: Payer: Self-pay

## 2024-06-21 ENCOUNTER — Other Ambulatory Visit: Payer: Self-pay

## 2024-06-22 ENCOUNTER — Other Ambulatory Visit (HOSPITAL_COMMUNITY): Payer: Self-pay

## 2024-06-22 ENCOUNTER — Other Ambulatory Visit: Payer: Self-pay

## 2024-07-16 ENCOUNTER — Other Ambulatory Visit: Payer: Self-pay

## 2024-07-16 ENCOUNTER — Other Ambulatory Visit (HOSPITAL_COMMUNITY): Payer: Self-pay

## 2024-07-16 MED ORDER — METFORMIN HCL 1000 MG PO TABS
1000.0000 mg | ORAL_TABLET | Freq: Two times a day (BID) | ORAL | 0 refills | Status: DC
  Filled 2024-07-16: qty 180, 90d supply, fill #0
  Filled 2024-07-17 – 2024-07-18 (×2): qty 60, 30d supply, fill #0
  Filled 2024-08-16 – 2024-08-21 (×2): qty 60, 30d supply, fill #1
  Filled 2024-09-12 – 2024-09-18 (×3): qty 60, 30d supply, fill #2

## 2024-07-17 ENCOUNTER — Other Ambulatory Visit: Payer: Self-pay

## 2024-07-18 ENCOUNTER — Other Ambulatory Visit (HOSPITAL_BASED_OUTPATIENT_CLINIC_OR_DEPARTMENT_OTHER): Payer: Self-pay

## 2024-07-18 ENCOUNTER — Other Ambulatory Visit: Payer: Self-pay

## 2024-07-20 ENCOUNTER — Other Ambulatory Visit: Payer: Self-pay

## 2024-07-25 ENCOUNTER — Other Ambulatory Visit (HOSPITAL_COMMUNITY): Payer: Self-pay

## 2024-07-27 ENCOUNTER — Other Ambulatory Visit (HOSPITAL_COMMUNITY): Payer: Self-pay

## 2024-07-31 ENCOUNTER — Other Ambulatory Visit: Payer: Self-pay

## 2024-07-31 ENCOUNTER — Other Ambulatory Visit (HOSPITAL_BASED_OUTPATIENT_CLINIC_OR_DEPARTMENT_OTHER): Payer: Self-pay

## 2024-08-10 ENCOUNTER — Other Ambulatory Visit: Payer: Self-pay

## 2024-08-13 ENCOUNTER — Other Ambulatory Visit (HOSPITAL_COMMUNITY): Payer: Self-pay

## 2024-08-16 ENCOUNTER — Other Ambulatory Visit: Payer: Self-pay

## 2024-08-17 ENCOUNTER — Other Ambulatory Visit: Payer: Self-pay

## 2024-08-20 ENCOUNTER — Other Ambulatory Visit (HOSPITAL_COMMUNITY): Payer: Self-pay

## 2024-08-20 ENCOUNTER — Other Ambulatory Visit: Payer: Self-pay

## 2024-08-20 MED ORDER — LOSARTAN POTASSIUM 100 MG PO TABS
100.0000 mg | ORAL_TABLET | Freq: Every day | ORAL | 0 refills | Status: DC
  Filled 2024-08-20: qty 90, 90d supply, fill #0
  Filled 2024-08-21: qty 30, 30d supply, fill #0
  Filled 2024-09-12 – 2024-09-18 (×3): qty 30, 30d supply, fill #1
  Filled 2024-10-15: qty 30, 30d supply, fill #2

## 2024-08-20 MED ORDER — METOPROLOL SUCCINATE ER 50 MG PO TB24
50.0000 mg | ORAL_TABLET | Freq: Every day | ORAL | 0 refills | Status: DC
  Filled 2024-08-20: qty 90, 90d supply, fill #0
  Filled 2024-08-21: qty 30, 30d supply, fill #0
  Filled 2024-09-12 – 2024-09-18 (×3): qty 30, 30d supply, fill #1
  Filled 2024-10-15: qty 30, 30d supply, fill #2

## 2024-08-20 MED ORDER — HYDROCHLOROTHIAZIDE 25 MG PO TABS
25.0000 mg | ORAL_TABLET | Freq: Every morning | ORAL | 0 refills | Status: DC
  Filled 2024-08-20: qty 90, 90d supply, fill #0
  Filled 2024-08-21: qty 30, 30d supply, fill #0
  Filled 2024-09-12 – 2024-09-18 (×3): qty 30, 30d supply, fill #1
  Filled 2024-10-15: qty 30, 30d supply, fill #2

## 2024-08-20 MED ORDER — VITAMIN B 12 500 MCG PO TABS
1.0000 | ORAL_TABLET | Freq: Every day | ORAL | 0 refills | Status: AC
  Filled 2024-08-20: qty 90, 90d supply, fill #0
  Filled 2024-08-21: qty 30, 30d supply, fill #0
  Filled 2024-09-12 – 2024-09-18 (×3): qty 30, 30d supply, fill #1
  Filled 2024-10-15: qty 30, 30d supply, fill #2

## 2024-08-21 ENCOUNTER — Other Ambulatory Visit: Payer: Self-pay

## 2024-08-28 ENCOUNTER — Encounter: Payer: Self-pay | Admitting: Urology

## 2024-09-12 ENCOUNTER — Other Ambulatory Visit: Payer: Self-pay

## 2024-09-13 ENCOUNTER — Other Ambulatory Visit: Payer: Self-pay

## 2024-09-17 ENCOUNTER — Other Ambulatory Visit: Payer: Self-pay

## 2024-09-18 ENCOUNTER — Other Ambulatory Visit: Payer: Self-pay

## 2024-09-19 ENCOUNTER — Other Ambulatory Visit: Payer: Self-pay

## 2024-09-20 ENCOUNTER — Other Ambulatory Visit: Payer: Self-pay

## 2024-09-21 ENCOUNTER — Other Ambulatory Visit: Payer: Self-pay

## 2024-09-24 ENCOUNTER — Other Ambulatory Visit: Payer: Self-pay

## 2024-10-09 DIAGNOSIS — E1169 Type 2 diabetes mellitus with other specified complication: Secondary | ICD-10-CM | POA: Diagnosis not present

## 2024-10-10 ENCOUNTER — Other Ambulatory Visit (HOSPITAL_COMMUNITY): Payer: Self-pay

## 2024-10-10 ENCOUNTER — Other Ambulatory Visit: Payer: Self-pay

## 2024-10-10 DIAGNOSIS — L821 Other seborrheic keratosis: Secondary | ICD-10-CM | POA: Diagnosis not present

## 2024-10-10 DIAGNOSIS — L814 Other melanin hyperpigmentation: Secondary | ICD-10-CM | POA: Diagnosis not present

## 2024-10-10 DIAGNOSIS — L578 Other skin changes due to chronic exposure to nonionizing radiation: Secondary | ICD-10-CM | POA: Diagnosis not present

## 2024-10-10 DIAGNOSIS — L570 Actinic keratosis: Secondary | ICD-10-CM | POA: Diagnosis not present

## 2024-10-10 DIAGNOSIS — D1801 Hemangioma of skin and subcutaneous tissue: Secondary | ICD-10-CM | POA: Diagnosis not present

## 2024-10-10 DIAGNOSIS — L728 Other follicular cysts of the skin and subcutaneous tissue: Secondary | ICD-10-CM | POA: Diagnosis not present

## 2024-10-10 MED ORDER — FLUOROURACIL 5 % EX CREA
1.0000 | TOPICAL_CREAM | CUTANEOUS | 0 refills | Status: AC
  Filled 2024-10-10: qty 40, 30d supply, fill #0

## 2024-10-11 ENCOUNTER — Other Ambulatory Visit (HOSPITAL_COMMUNITY): Payer: Self-pay

## 2024-10-11 MED ORDER — ONETOUCH VERIO VI STRP
ORAL_STRIP | 5 refills | Status: AC
  Filled 2024-10-11: qty 100, 100d supply, fill #0

## 2024-10-12 ENCOUNTER — Other Ambulatory Visit: Payer: Self-pay

## 2024-10-15 ENCOUNTER — Other Ambulatory Visit (HOSPITAL_COMMUNITY): Payer: Self-pay

## 2024-10-15 ENCOUNTER — Other Ambulatory Visit: Payer: Self-pay

## 2024-10-15 MED ORDER — METFORMIN HCL 1000 MG PO TABS
1000.0000 mg | ORAL_TABLET | Freq: Two times a day (BID) | ORAL | 0 refills | Status: AC
  Filled 2024-10-15: qty 60, 30d supply, fill #0
  Filled 2024-11-14: qty 60, 30d supply, fill #1
  Filled 2024-12-17: qty 60, 30d supply, fill #2

## 2024-10-16 ENCOUNTER — Other Ambulatory Visit (HOSPITAL_COMMUNITY): Payer: Self-pay

## 2024-10-16 ENCOUNTER — Other Ambulatory Visit: Payer: Self-pay

## 2024-10-16 DIAGNOSIS — Z6829 Body mass index (BMI) 29.0-29.9, adult: Secondary | ICD-10-CM | POA: Diagnosis not present

## 2024-10-16 DIAGNOSIS — I10 Essential (primary) hypertension: Secondary | ICD-10-CM | POA: Diagnosis not present

## 2024-10-16 DIAGNOSIS — I700 Atherosclerosis of aorta: Secondary | ICD-10-CM | POA: Diagnosis not present

## 2024-10-16 DIAGNOSIS — E119 Type 2 diabetes mellitus without complications: Secondary | ICD-10-CM | POA: Diagnosis not present

## 2024-10-16 MED ORDER — MOUNJARO 2.5 MG/0.5ML ~~LOC~~ SOAJ
2.5000 mg | SUBCUTANEOUS | 2 refills | Status: AC
  Filled 2024-10-16: qty 4, 56d supply, fill #0
  Filled 2024-10-17: qty 2, 28d supply, fill #0
  Filled 2024-10-19: qty 4, 56d supply, fill #0

## 2024-10-17 ENCOUNTER — Other Ambulatory Visit (HOSPITAL_COMMUNITY): Payer: Self-pay

## 2024-10-17 ENCOUNTER — Other Ambulatory Visit: Payer: Self-pay

## 2024-10-18 ENCOUNTER — Other Ambulatory Visit: Payer: Self-pay

## 2024-10-19 ENCOUNTER — Other Ambulatory Visit: Payer: Self-pay

## 2024-10-19 ENCOUNTER — Other Ambulatory Visit (HOSPITAL_COMMUNITY): Payer: Self-pay

## 2024-10-22 ENCOUNTER — Other Ambulatory Visit (HOSPITAL_COMMUNITY): Payer: Self-pay

## 2024-10-22 ENCOUNTER — Other Ambulatory Visit: Payer: Self-pay

## 2024-10-29 ENCOUNTER — Other Ambulatory Visit: Payer: Self-pay | Admitting: Allergy

## 2024-11-08 ENCOUNTER — Other Ambulatory Visit: Payer: Self-pay

## 2024-11-08 ENCOUNTER — Other Ambulatory Visit (HOSPITAL_COMMUNITY): Payer: Self-pay

## 2024-11-08 MED ORDER — MOUNJARO 5 MG/0.5ML ~~LOC~~ SOAJ
5.0000 mg | SUBCUTANEOUS | 0 refills | Status: DC
  Filled 2024-11-08: qty 2, 28d supply, fill #0

## 2024-11-13 ENCOUNTER — Other Ambulatory Visit: Payer: Self-pay

## 2024-11-13 ENCOUNTER — Other Ambulatory Visit (HOSPITAL_COMMUNITY): Payer: Self-pay

## 2024-11-13 MED ORDER — AMLODIPINE BESYLATE 5 MG PO TABS
5.0000 mg | ORAL_TABLET | Freq: Every day | ORAL | 0 refills | Status: AC
  Filled 2024-11-13: qty 30, 30d supply, fill #0
  Filled 2024-12-17: qty 30, 30d supply, fill #1

## 2024-11-13 MED ORDER — METOPROLOL SUCCINATE ER 50 MG PO TB24
50.0000 mg | ORAL_TABLET | Freq: Every day | ORAL | 0 refills | Status: AC
  Filled 2024-11-13: qty 30, 30d supply, fill #0
  Filled 2024-12-17: qty 30, 30d supply, fill #1

## 2024-11-13 MED ORDER — HYDROCHLOROTHIAZIDE 25 MG PO TABS
25.0000 mg | ORAL_TABLET | Freq: Every morning | ORAL | 0 refills | Status: AC
  Filled 2024-11-13: qty 30, 30d supply, fill #0
  Filled 2024-12-17: qty 30, 30d supply, fill #1

## 2024-11-13 MED ORDER — LOSARTAN POTASSIUM 100 MG PO TABS
100.0000 mg | ORAL_TABLET | Freq: Every day | ORAL | 0 refills | Status: AC
  Filled 2024-11-13: qty 30, 30d supply, fill #0
  Filled 2024-12-17: qty 30, 30d supply, fill #1

## 2024-11-14 ENCOUNTER — Other Ambulatory Visit: Payer: Self-pay

## 2024-11-14 ENCOUNTER — Other Ambulatory Visit (HOSPITAL_COMMUNITY): Payer: Self-pay

## 2024-11-14 MED ORDER — ASPIRIN 81 MG PO TBEC
81.0000 mg | DELAYED_RELEASE_TABLET | Freq: Every day | ORAL | 1 refills | Status: AC
  Filled 2024-11-14: qty 30, 30d supply, fill #0
  Filled 2024-12-17: qty 30, 30d supply, fill #1

## 2024-11-15 ENCOUNTER — Other Ambulatory Visit: Payer: Self-pay

## 2024-12-17 ENCOUNTER — Other Ambulatory Visit: Payer: Self-pay

## 2024-12-18 ENCOUNTER — Other Ambulatory Visit: Payer: Self-pay

## 2024-12-18 ENCOUNTER — Other Ambulatory Visit (HOSPITAL_COMMUNITY): Payer: Self-pay

## 2024-12-27 ENCOUNTER — Other Ambulatory Visit (HOSPITAL_COMMUNITY): Payer: Self-pay

## 2024-12-27 ENCOUNTER — Other Ambulatory Visit: Payer: Self-pay

## 2024-12-27 MED ORDER — MOUNJARO 5 MG/0.5ML ~~LOC~~ SOAJ
5.0000 mg | SUBCUTANEOUS | 0 refills | Status: AC
  Filled 2024-12-27 (×2): qty 2, 28d supply, fill #0

## 2024-12-28 ENCOUNTER — Other Ambulatory Visit (HOSPITAL_COMMUNITY): Payer: Self-pay

## 2024-12-28 ENCOUNTER — Other Ambulatory Visit: Payer: Self-pay

## 2024-12-31 ENCOUNTER — Other Ambulatory Visit: Payer: Self-pay

## 2025-06-20 ENCOUNTER — Ambulatory Visit: Admitting: Urology

## 2025-06-25 ENCOUNTER — Ambulatory Visit: Admitting: Urology
